# Patient Record
Sex: Male | Born: 1990 | Race: Black or African American | Hispanic: No | Marital: Married | Smoking: Never smoker
Health system: Southern US, Community
[De-identification: ages and names within clinical notes are randomized; demographics above are authoritative.]

## PROBLEM LIST (undated history)

## (undated) DIAGNOSIS — M549 Dorsalgia, unspecified: Secondary | ICD-10-CM

## (undated) DIAGNOSIS — G8929 Other chronic pain: Secondary | ICD-10-CM

## (undated) HISTORY — PX: ADENOIDECTOMY: SUR15

## (undated) HISTORY — PX: SKIN GRAFT: SHX250

## (undated) HISTORY — PX: WISDOM TOOTH EXTRACTION: SHX21

---

## 2011-11-12 ENCOUNTER — Ambulatory Visit (HOSPITAL_COMMUNITY): Payer: Medicaid Other | Admitting: Physician Assistant

## 2012-09-18 ENCOUNTER — Emergency Department (HOSPITAL_COMMUNITY)
Admission: EM | Admit: 2012-09-18 | Discharge: 2012-09-18 | Disposition: A | Discharge status: Home / Self Care | Payer: Medicaid Other | Attending: Emergency Medicine | Admitting: Emergency Medicine

## 2012-09-18 ENCOUNTER — Emergency Department (HOSPITAL_COMMUNITY): Payer: Medicaid Other

## 2012-09-18 ENCOUNTER — Encounter (HOSPITAL_COMMUNITY): Payer: Self-pay | Admitting: Emergency Medicine

## 2012-09-18 DIAGNOSIS — R059 Cough, unspecified: Secondary | ICD-10-CM | POA: Insufficient documentation

## 2012-09-18 DIAGNOSIS — R5383 Other fatigue: Secondary | ICD-10-CM | POA: Insufficient documentation

## 2012-09-18 DIAGNOSIS — R5381 Other malaise: Secondary | ICD-10-CM | POA: Insufficient documentation

## 2012-09-18 DIAGNOSIS — Z79899 Other long term (current) drug therapy: Secondary | ICD-10-CM | POA: Insufficient documentation

## 2012-09-18 DIAGNOSIS — R05 Cough: Secondary | ICD-10-CM | POA: Insufficient documentation

## 2012-09-18 DIAGNOSIS — B9789 Other viral agents as the cause of diseases classified elsewhere: Secondary | ICD-10-CM | POA: Insufficient documentation

## 2012-09-18 DIAGNOSIS — J3489 Other specified disorders of nose and nasal sinuses: Secondary | ICD-10-CM | POA: Insufficient documentation

## 2012-09-18 DIAGNOSIS — R630 Anorexia: Secondary | ICD-10-CM | POA: Insufficient documentation

## 2012-09-18 DIAGNOSIS — B349 Viral infection, unspecified: Secondary | ICD-10-CM

## 2012-09-18 MED ORDER — OXYCODONE-ACETAMINOPHEN 5-325 MG PO TABS: 1.0000 | ORAL_TABLET | Freq: Four times a day (QID) | ORAL | Status: DC | PRN

## 2012-09-18 MED ORDER — MORPHINE SULFATE 4 MG/ML IJ SOLN
4.0000 mg | Freq: Once | INTRAMUSCULAR | Status: AC
  Administered 2012-09-18: 4 mg via INTRAVENOUS
  Filled 2012-09-18: qty 1

## 2012-09-18 MED ORDER — GUAIFENESIN-DM 100-10 MG/5ML PO SYRP: 5.0000 mL | ORAL_SOLUTION | Freq: Three times a day (TID) | ORAL | Status: DC | PRN

## 2012-09-18 MED ORDER — SODIUM CHLORIDE 0.9 % IV BOLUS (SEPSIS)
1000.0000 mL | Freq: Once | INTRAVENOUS | Status: AC
  Administered 2012-09-18: 1000 mL via INTRAVENOUS

## 2012-09-18 NOTE — ED Notes (Signed)
Patient transported to X-ray 

## 2012-09-18 NOTE — ED Notes (Signed)
Pt states he has been sick since before Christmas. Pt states he has been having a cough and chest congestion.

## 2012-09-18 NOTE — ED Provider Notes (Signed)
History     CSN: [id number]  Arrival date & time 09/18/12  9562   First MD Initiated Contact with Patient 09/18/12 0355      Chief Complaint  Patient presents with  . Flu like symptoms     (Consider location/radiation/quality/duration/timing/severity/associated sxs/prior treatment) The history is provided by the patient.   patient states that he's been sick since before Christmas. He states he had cough chills and myalgias. He states he started to feel a little better then got worse again. He states his coughing and feeling bad all over. He vomited once. No diarrhea. He states he has been coughing up some sputum. He does have low-grade temperatures. No abdominal pain. No severe dyspnea.  History reviewed. No pertinent past medical history.  Past Surgical History  Procedure Date  . Skin graft     21-72 years old. on Right index finger  . Wisdom tooth extraction     Family History  Problem Relation Age of Onset  . Diabetes Mother   . Osteoarthritis Father   . Diabetes Father   . Heart failure Maternal Aunt     History  Substance Use Topics  . Smoking status: Never Smoker   . Smokeless tobacco: Not on file  . Alcohol Use: Yes     Comment: Occasionally      Review of Systems  Constitutional: Positive for appetite change and fatigue. Negative for activity change.  HENT: Positive for rhinorrhea. Negative for sore throat and neck stiffness.   Eyes: Negative for pain.  Respiratory: Positive for cough. Negative for chest tightness and shortness of breath.   Cardiovascular: Negative for chest pain and leg swelling.  Gastrointestinal: Negative for nausea, vomiting, abdominal pain and diarrhea.  Genitourinary: Negative for flank pain.  Musculoskeletal: Positive for myalgias. Negative for back pain.  Skin: Negative for rash.  Neurological: Negative for weakness, numbness and headaches.  Psychiatric/Behavioral: Negative for behavioral problems.    Allergies  Asa; Codeine;  and Penicillins  Home Medications   Current Outpatient Rx  Name  Route  Sig  Dispense  Refill  . GUAIFENESIN-DM 100-10 MG/5ML PO SYRP   Oral   Take 5 mLs by mouth 3 (three) times daily as needed for cough.   118 mL   0   . OXYCODONE-ACETAMINOPHEN 5-325 MG PO TABS   Oral   Take 1-2 tablets by mouth every 6 (six) hours as needed for pain.   15 tablet   0     BP 151/94  Pulse 76  Temp 98.4 F (36.9 C) (Oral)  Resp 18  SpO2 98%  Physical Exam  Constitutional: He is oriented to person, place, and time. He appears well-developed and well-nourished.  HENT:  Head: Normocephalic.  Eyes: Pupils are equal, round, and reactive to light.  Neck: Neck supple.  Cardiovascular: Normal rate and regular rhythm.   Pulmonary/Chest: Effort normal and breath sounds normal.  Abdominal: Soft. Bowel sounds are normal.  Musculoskeletal: Normal range of motion.  Lymphadenopathy:    He has no cervical adenopathy.  Neurological: He is alert and oriented to person, place, and time.  Skin: Skin is warm.  Psychiatric: He has a normal mood and affect.    ED Course  Procedures (including critical care time)  Labs Reviewed - No data to display Dg Chest 2 View  09/18/2012  *RADIOLOGY REPORT*  Clinical Data: Persistent cough and congestion.  Vomiting.  CHEST - 2 VIEW  Comparison: None.  Findings: The lungs are relatively well-aerated and clear.  There is no evidence of focal opacification, pleural effusion or pneumothorax.  The heart is borderline enlarged.  No acute osseous abnormalities are seen.  IMPRESSION: No acute cardiopulmonary process seen; borderline cardiomegaly.   Original Report Authenticated By: Tonia Ghent, M.D.      1. Viral syndrome       MDM  Patient with viral syndrome. X-ray was done and showed no pneumonia. Feels better after fluid bolus and some morphine. Patient was discharged home   Juliet Rude. Rubin Payor, MD 09/18/12 [id number]

## 2013-03-27 ENCOUNTER — Emergency Department (HOSPITAL_COMMUNITY)
Admission: EM | Admit: 2013-03-27 | Discharge: 2013-03-28 | Disposition: A | Discharge status: Home / Self Care | Payer: Medicaid Other | Attending: Emergency Medicine | Admitting: Emergency Medicine

## 2013-03-27 ENCOUNTER — Encounter (HOSPITAL_COMMUNITY): Payer: Self-pay | Admitting: *Deleted

## 2013-03-27 DIAGNOSIS — M549 Dorsalgia, unspecified: Secondary | ICD-10-CM

## 2013-03-27 DIAGNOSIS — M546 Pain in thoracic spine: Secondary | ICD-10-CM | POA: Insufficient documentation

## 2013-03-27 DIAGNOSIS — G8929 Other chronic pain: Secondary | ICD-10-CM | POA: Insufficient documentation

## 2013-03-27 HISTORY — DX: Other chronic pain: G89.29

## 2013-03-27 HISTORY — DX: Dorsalgia, unspecified: M54.9

## 2013-03-27 MED ORDER — METAXALONE 800 MG PO TABS: 800.0000 mg | ORAL_TABLET | Freq: Three times a day (TID) | ORAL | Status: DC

## 2013-03-27 MED ORDER — METAXALONE 800 MG PO TABS
800.0000 mg | ORAL_TABLET | Freq: Once | ORAL | Status: AC
  Administered 2013-03-27: 800 mg via ORAL
  Filled 2013-03-27: qty 1

## 2013-03-27 MED ORDER — OXYCODONE HCL 5 MG PO TABS
5.0000 mg | ORAL_TABLET | Freq: Once | ORAL | Status: AC
  Administered 2013-03-27: 5 mg via ORAL
  Filled 2013-03-27: qty 1

## 2013-03-27 MED ORDER — OXYCODONE HCL 5 MG PO TABS: 5.0000 mg | ORAL_TABLET | Freq: Four times a day (QID) | ORAL | Status: DC | PRN

## 2013-03-27 NOTE — ED Notes (Signed)
Pt states he has an old football injury to his back. Today worse than usual. Pain located between mid and lower back bilaterally.

## 2013-03-27 NOTE — ED Provider Notes (Signed)
History    CSN: [id number] Arrival date & time 03/27/13  2251  First MD Initiated Contact with Patient 03/27/13 2324     Chief Complaint  Patient presents with  . Back Pain   (Consider location/radiation/quality/duration/timing/severity/associated sxs/prior Treatment) HPI Comments: Has old football injury Has had 2 steroid injection in region last several years ago.  Now in [place], no PCP and increased pain   Has taken Ibuprofen with no relief   Patient is a 21 y.o. male presenting with back pain. The history is provided by the patient.  Back Pain Location:  Thoracic spine Quality:  Aching Radiates to:  Does not radiate Pain severity:  Moderate Onset quality:  Gradual Timing:  Constant Progression:  Worsening Chronicity:  Recurrent Relieved by:  Nothing Worsened by:  NSAIDs Ineffective treatments:  Ibuprofen Associated symptoms: no fever   Risk factors: obesity    Past Medical History  Diagnosis Date  . Back pain, chronic    Past Surgical History  Procedure Laterality Date  . Skin graft      32-59 years old. on Right index finger  . Wisdom tooth extraction     Family History  Problem Relation Age of Onset  . Diabetes Mother   . Osteoarthritis Father   . Diabetes Father   . Heart failure Maternal Aunt    History  Substance Use Topics  . Smoking status: Never Smoker   . Smokeless tobacco: Not on file  . Alcohol Use: Yes     Comment: Occasionally    Review of Systems  Constitutional: Negative for fever.  Respiratory: Negative for shortness of breath.   Musculoskeletal: Positive for back pain. Negative for joint swelling and gait problem.  Skin: Negative for rash and wound.  All other systems reviewed and are negative.    Allergies  Asa; Tylenol; Codeine; and Penicillins  Home Medications   Current Outpatient Rx  Name  Route  Sig  Dispense  Refill  . ibuprofen (ADVIL,MOTRIN) 200 MG tablet   Oral   Take 200 mg by mouth daily as needed for  pain.         . metaxalone (SKELAXIN) 800 MG tablet   Oral   Take 1 tablet (800 mg total) by mouth 3 (three) times daily.   30 tablet   0   . oxyCODONE (OXY IR/ROXICODONE) 5 MG immediate release tablet   Oral   Take 1 tablet (5 mg total) by mouth every 6 (six) hours as needed for pain (severe pain).   12 tablet   0    BP 164/85  Pulse 112  Temp(Src) 102.2 F (39 C) (Oral)  Resp 18  SpO2 96% Physical Exam  Nursing note and vitals reviewed. Constitutional: He is oriented to person, place, and time. He appears well-developed and well-nourished.  HENT:  Head: Normocephalic.  Eyes: Pupils are equal, round, and reactive to light.  Neck: Normal range of motion.  Cardiovascular: Normal rate.   Pulmonary/Chest: No respiratory distress.  Musculoskeletal: Normal range of motion.  Neurological: He is alert and oriented to person, place, and time.  Skin: Skin is warm. No rash noted. No erythema. No pallor.    ED Course  Procedures (including critical care time) Labs Reviewed - No data to display No results found. 1. Chronic back pain     MDM  Will treat with muscle relaxor. There is no obvious sign of injury, no rash, decrease in ROM, no change in gait   Arman Filter, NP  03/27/13 2342 

## 2013-03-28 NOTE — ED Provider Notes (Signed)
Medical screening examination/treatment/procedure(s) were performed by non-physician practitioner and as supervising physician I was immediately available for consultation/collaboration.  John-Adam Dyasia Firestine, M.D.     John-Adam Issis Lindseth, MD 03/28/13 0847 

## 2013-07-23 ENCOUNTER — Emergency Department (HOSPITAL_COMMUNITY)
Admission: EM | Admit: 2013-07-23 | Discharge: 2013-07-23 | Disposition: A | Discharge status: Home / Self Care | Payer: Medicaid Other | Attending: Emergency Medicine | Admitting: Emergency Medicine

## 2013-07-23 DIAGNOSIS — Z880 Allergy status to penicillin: Secondary | ICD-10-CM | POA: Insufficient documentation

## 2013-07-23 DIAGNOSIS — T7840XA Allergy, unspecified, initial encounter: Secondary | ICD-10-CM

## 2013-07-23 DIAGNOSIS — IMO0002 Reserved for concepts with insufficient information to code with codable children: Secondary | ICD-10-CM | POA: Insufficient documentation

## 2013-07-23 DIAGNOSIS — R21 Rash and other nonspecific skin eruption: Secondary | ICD-10-CM | POA: Insufficient documentation

## 2013-07-23 MED ORDER — DEXAMETHASONE SODIUM PHOSPHATE 10 MG/ML IJ SOLN: 10.0000 mg | Freq: Once | INTRAMUSCULAR | Status: DC

## 2013-07-23 MED ORDER — DEXAMETHASONE SODIUM PHOSPHATE 10 MG/ML IJ SOLN
10.0000 mg | Freq: Once | INTRAMUSCULAR | Status: AC
  Administered 2013-07-23: 10 mg via INTRAMUSCULAR
  Filled 2013-07-23: qty 1

## 2013-07-23 MED ORDER — DIPHENHYDRAMINE HCL 25 MG PO TABS: 25.0000 mg | ORAL_TABLET | Freq: Four times a day (QID) | ORAL | Status: DC

## 2013-07-23 MED ORDER — DIPHENHYDRAMINE HCL 25 MG PO CAPS
25.0000 mg | ORAL_CAPSULE | Freq: Once | ORAL | Status: AC
  Administered 2013-07-23: 25 mg via ORAL
  Filled 2013-07-23: qty 1

## 2013-07-23 NOTE — ED Provider Notes (Signed)
CSN: [id number]     Arrival date & time 07/23/13  2016 History  This chart was scribed for non-physician practitioner Ivonne Andrew, PA-C working with Gavin Pound. Oletta Lamas, MD by Caryn Bee, ED Scribe. This patient was seen in room WTR5/WTR5 and the patient's care was started at 8:49 PM.    Chief Complaint  Patient presents with  . Rash    HPI HPI Comments: Curtis Vega is a 22 y.o. male who presents to the Emergency Department complaining of sudden onset, itchy rash that pt noticed upon waking up on 07/20/2013. Pt states that when he woke up on 07/19/2013 morning his right arm was itching.  The rash has since spread to his lower back, bilateral legs, back of his neck, ankles, and feet. Pt tried using cortisone cream with no relief. Pt thinks that it may have been the laundry detergent that his mother used when he went to visit. He denies fever, sweats, chills, mouth or tongue swelling. He denies eating any new foods recently, using any new soaps or lotions, being in the woods recently or being bitten by anything. Pt denies allergies or any other medical problems. He denies anyone else in his family having similar symptoms. Pt has had chicken pox. Pt does not take regular medications.    Past Medical History  Diagnosis Date  . Back pain, chronic    Past Surgical History  Procedure Laterality Date  . Skin graft      60-71 years old. on Right index finger  . Wisdom tooth extraction     Family History  Problem Relation Age of Onset  . Diabetes Mother   . Osteoarthritis Father   . Diabetes Father   . Heart failure Maternal Aunt    History  Substance Use Topics  . Smoking status: Never Smoker   . Smokeless tobacco: Not on file  . Alcohol Use: Yes     Comment: Occasionally    Review of Systems  Constitutional: Negative for fever, chills and diaphoresis.  HENT: Negative for mouth sores.   Skin: Positive for color change (Erythema ) and rash (bilateral arms, bilateral legs, back, neck).   All other systems reviewed and are negative.    Allergies  Asa; Tylenol; Codeine; and Penicillins  Home Medications   Current Outpatient Rx  Name  Route  Sig  Dispense  Refill  . hydrocortisone cream 1 %   Topical   Apply 1 application topically 2 (two) times daily.          Triage Vitals: BP 132/79  Pulse 69  Temp(Src) 98.3 F (36.8 C) (Oral)  Resp 16  SpO2 98%  Physical Exam  Nursing note and vitals reviewed. Constitutional: He is oriented to person, place, and time. He appears well-developed and well-nourished. No distress.  HENT:  Head: Normocephalic and atraumatic.  Mouth/Throat: Oropharynx is clear and moist. No oral lesions. No oropharyngeal exudate, posterior oropharyngeal edema or posterior oropharyngeal erythema.  Eyes: EOM are normal.  Neck: Neck supple. No tracheal deviation present.  Cardiovascular: Normal rate, regular rhythm and normal heart sounds.  Exam reveals no gallop and no friction rub.   No murmur heard. Pulmonary/Chest: Effort normal and breath sounds normal. No respiratory distress. He has no wheezes. He has no rales. He exhibits no tenderness.  Musculoskeletal: Normal range of motion.  Neurological: He is alert and oriented to person, place, and time.  Skin: Skin is warm and dry. Rash noted.  Diffuse papular rash with erythema on lower back, bilateral  arms, bilateral legs, and neck.  Psychiatric: He has a normal mood and affect. His behavior is normal.    ED Course  Procedures  DIAGNOSTIC STUDIES: Oxygen Saturation is 98% on room air, normal by my interpretation.    COORDINATION OF CARE: 8:59 PM-patient is well-appearing in no acute distress. Discussed treatment plan with pt at bedside and pt agreed to plan.    MDM   1. Rash   2. Allergic reaction, initial encounter      I personally performed the services described in this documentation, which was scribed in my presence. The recorded information has been reviewed and is  accurate.    Angus Seller, PA-C 07/24/13 0020

## 2013-07-23 NOTE — ED Notes (Signed)
Pt has bumpy papular rash that started on Monday. Now rash has spread to trunk, legs, neck and back. Pt states he had some itching initially, but not now. Pt states he has used cortisone cream to affected areas. Pt with no acute distress.

## 2013-07-24 NOTE — ED Provider Notes (Signed)
Medical screening examination/treatment/procedure(s) were performed by non-physician practitioner and as supervising physician I was immediately available for consultation/collaboration.  EKG Interpretation   None         Gavin Pound. Lasonya Hubner, MD 07/24/13 1037

## 2013-08-11 ENCOUNTER — Encounter (HOSPITAL_COMMUNITY): Payer: Self-pay | Admitting: Emergency Medicine

## 2013-08-11 ENCOUNTER — Emergency Department (HOSPITAL_COMMUNITY)
Admission: EM | Admit: 2013-08-11 | Discharge: 2013-08-11 | Disposition: A | Discharge status: Home / Self Care | Payer: Medicaid Other | Attending: Emergency Medicine | Admitting: Emergency Medicine

## 2013-08-11 ENCOUNTER — Emergency Department (HOSPITAL_COMMUNITY): Payer: Medicaid Other

## 2013-08-11 DIAGNOSIS — Z880 Allergy status to penicillin: Secondary | ICD-10-CM | POA: Insufficient documentation

## 2013-08-11 DIAGNOSIS — IMO0001 Reserved for inherently not codable concepts without codable children: Secondary | ICD-10-CM | POA: Insufficient documentation

## 2013-08-11 DIAGNOSIS — G8929 Other chronic pain: Secondary | ICD-10-CM | POA: Insufficient documentation

## 2013-08-11 DIAGNOSIS — R630 Anorexia: Secondary | ICD-10-CM | POA: Insufficient documentation

## 2013-08-11 DIAGNOSIS — J020 Streptococcal pharyngitis: Secondary | ICD-10-CM

## 2013-08-11 LAB — RAPID STREP SCREEN (MED CTR MEBANE ONLY): Streptococcus, Group A Screen (Direct): POSITIVE — AB

## 2013-08-11 MED ORDER — CLINDAMYCIN HCL 150 MG PO CAPS: 450.0000 mg | ORAL_CAPSULE | Freq: Three times a day (TID) | ORAL | Status: DC

## 2013-08-11 NOTE — ED Provider Notes (Signed)
CSN: [id number]     Arrival date & time 08/11/13  1800 History   First MD Initiated Contact with Patient 08/11/13 1804    This chart was scribed for Vinetta Bergamo, a non-physician practitioner working with Ethelda Chick, MD by Lewanda Rife, ED Scribe. This patient was seen in room WTR6/WTR6 and the patient's care was started at 8:12 PM     Chief Complaint  Patient presents with  . Fever  . Sore Throat   (Consider location/radiation/quality/duration/timing/severity/associated sxs/prior Treatment) The history is provided by the patient. No language interpreter was used.   HPI Comments: Curtis Vega is a 22 y.o. male who presents to the Emergency Department complaining of worsening constant sore throat onset 2 days. Reports associated fever, generalized myalgias, decreased appetite, chills, sweats, and headache. Reports sore throat is exacerbated by swallowing food. Denies any alleviating factors. Denies associated cough, abdominal pain, nausea, vomiting, neck stiffness, diarrhea, otalgia, eye pain, visual disturbances, nasal congestion, and difficulty breathing. Reports taking ibuprofen with mild relief of symptoms. Denies sick contacts.    Past Medical History  Diagnosis Date  . Back pain, chronic    Past Surgical History  Procedure Laterality Date  . Skin graft      32-19 years old. on Right index finger  . Wisdom tooth extraction     Family History  Problem Relation Age of Onset  . Diabetes Mother   . Osteoarthritis Father   . Diabetes Father   . Heart failure Maternal Aunt    History  Substance Use Topics  . Smoking status: Never Smoker   . Smokeless tobacco: Not on file  . Alcohol Use: Yes     Comment: Occasionally    Review of Systems  Constitutional: Positive for fever.  HENT: Positive for sore throat.   All other systems reviewed and are negative.    Allergies  Asa; Tylenol; Codeine; and Penicillins  Home Medications   Current Outpatient Rx   Name  Route  Sig  Dispense  Refill  . ibuprofen (ADVIL,MOTRIN) 200 MG tablet   Oral   Take 200 mg by mouth every 6 (six) hours as needed for mild pain.         . clindamycin (CLEOCIN) 150 MG capsule   Oral   Take 3 capsules (450 mg total) by mouth 3 (three) times daily.   90 capsule   0    BP 145/71  Pulse 106  Temp(Src) 99.9 F (37.7 C) (Oral)  Resp 16  SpO2 97% Physical Exam  Nursing note and vitals reviewed. Constitutional: He is oriented to person, place, and time. He appears well-developed and well-nourished. No distress.  HENT:  Head: Normocephalic and atraumatic.  Right Ear: External ear normal.  Left Ear: External ear normal.  Mouth/Throat: Oropharyngeal exudate present.  Negative facial swelling Swelling and erythema noted to the posterior oropharynx and bilateral tonsils. Bilateral tonsillar identified to have exudate. Uvula midline, symmetrical elevation. Negative petechiae. Negative oral lesions noted. Negative trismus.  Eyes: Conjunctivae and EOM are normal. Pupils are equal, round, and reactive to light. Right eye exhibits no discharge. Left eye exhibits no discharge.  Neck: Normal range of motion. Neck supple. No tracheal deviation present.  Positive tonsillar adenopathy, bilaterally - soft, tender upon palpation, mobile. Negative submental submandibular adenopathy identified. Negative neck stiffness, negative nuchal rigidity Negative meningeal signs  Cardiovascular: Normal rate, regular rhythm and normal heart sounds.  Exam reveals no friction rub.   No murmur heard. Pulses:  Radial pulses are 2+ on the right side, and 2+ on the left side.  Pulmonary/Chest: Effort normal and breath sounds normal. No respiratory distress. He has no wheezes. He has no rales.  Musculoskeletal: Normal range of motion.  Full range of motion to upper and lower extremities bilaterally without difficulty noted  Lymphadenopathy:    He has cervical adenopathy (bilateral  tonsillar adenopathy noted - soft, mobile, mild discomfort upon palpation ).  Neurological: He is alert and oriented to person, place, and time. He exhibits normal muscle tone. Coordination normal.  Skin: Skin is warm and dry. No rash noted. He is not diaphoretic. No erythema.  Psychiatric: He has a normal mood and affect. His behavior is normal. Thought content normal.    ED Course  Procedures  COORDINATION OF CARE:  Nursing notes reviewed. Vital signs reviewed. Initial pt interview and examination performed.   8:12 PM-Discussed work up plan with pt at bedside, which includes rapid strep screen. Pt agrees with plan.  8:12 PM Nursing Notes Reviewed/ Care Coordinated Applicable Imaging Reviewed  Interpretation of Laboratory Data incorporated into ED treatment Discussed results and treatment plan with pt. Pt demonstrates understanding and agrees with plan.   Treatment plan initiated:Medications - No data to display   Initial diagnostic testing ordered.    Labs Review Labs Reviewed  RAPID STREP SCREEN - Abnormal; Notable for the following:    Streptococcus, Group A Screen (Direct) POSITIVE (*)    All other components within normal limits   Imaging Review Dg Chest 2 View  08/11/2013   CLINICAL DATA:  Fever.  EXAM: CHEST  2 VIEW  COMPARISON:  September 18, 2012.  FINDINGS: The heart size and mediastinal contours are within normal limits. Both lungs are clear. No pleural effusion or pneumothorax is noted. The visualized skeletal structures are unremarkable.  IMPRESSION: No active cardiopulmonary disease.   Electronically Signed   By: Roque Lias M.D.   On: 08/11/2013 18:42    EKG Interpretation   None       MDM   1. Streptococcal pharyngitis    Filed Vitals:   08/11/13 1806  BP: 145/71  Pulse: 106  Temp: 99.9 F (37.7 C)  TempSrc: Oral  Resp: 16  SpO2: 97%   I personally performed the services described in this documentation, which was scribed in my presence. The  recorded information has been reviewed and is accurate.  Patient presenting to emergency department with sore throat that started Tuesday and has gotten progressively worse with subjective fever and generalized bodyaches. Patient reports that he has discomfort with swallowing, worsening with solids than with liquids. Patient reports he's been using ibuprofen as needed for discomfort. Alert and oriented. GCS 15. Heart rate and rhythm normal. Lungs clear to auscultation bilaterally. Full range of motion to upper and lower extremities noted. Negative facial swelling. Bilateral tonsillar adenopathy with exudate present. Negative petechiae noted. Uvula midline, symmetrical elevation. Discomfort upon palpation to the tonsils-mobile, tender upon palpation, soft - negative remaining lymphadenopathy. Negative nuchal rigidity, negative neck stiffness, negative meningeal signs. Rapid strep test positive for group A streptococcal infection. Chest x-ray negative acute cardiopulmonary disease. Patient stable, afebrile. Doubt peritonsillar abscess. Positive streptococcal swab test, streptococcal pharyngitis noted. Discharged patient. Discharged patient with antibiotics. Discussed with patient to rest and stay hydrated. [facility]. Discussed with patient to closely monitor symptoms and if symptoms are to worsen or change to report back to emergency department - strict return structures given.  Patient agreed to plan of care, understood, all questions answered.  Raymon Mutton, PA-C 08/12/13 1603

## 2013-08-11 NOTE — ED Notes (Signed)
Pt reports sore throat since Monday, 8/10. Denies cough.

## 2013-08-13 NOTE — ED Provider Notes (Signed)
Medical screening examination/treatment/procedure(s) were performed by non-physician practitioner and as supervising physician I was immediately available for consultation/collaboration.  EKG Interpretation   None        Irvine Glorioso K Linker, MD 08/13/13 1506 

## 2013-09-17 ENCOUNTER — Emergency Department (HOSPITAL_COMMUNITY)
Admission: EM | Admit: 2013-09-17 | Discharge: 2013-09-17 | Disposition: A | Discharge status: Home / Self Care | Payer: Medicaid Other | Attending: Emergency Medicine | Admitting: Emergency Medicine

## 2013-09-17 ENCOUNTER — Encounter (HOSPITAL_COMMUNITY): Payer: Self-pay | Admitting: Emergency Medicine

## 2013-09-17 DIAGNOSIS — G8929 Other chronic pain: Secondary | ICD-10-CM | POA: Insufficient documentation

## 2013-09-17 DIAGNOSIS — M549 Dorsalgia, unspecified: Secondary | ICD-10-CM | POA: Insufficient documentation

## 2013-09-17 DIAGNOSIS — J020 Streptococcal pharyngitis: Secondary | ICD-10-CM

## 2013-09-17 DIAGNOSIS — Z880 Allergy status to penicillin: Secondary | ICD-10-CM | POA: Insufficient documentation

## 2013-09-17 LAB — RAPID STREP SCREEN (MED CTR MEBANE ONLY): Streptococcus, Group A Screen (Direct): POSITIVE — AB

## 2013-09-17 MED ORDER — CLINDAMYCIN HCL 150 MG PO CAPS: 150.0000 mg | ORAL_CAPSULE | Freq: Four times a day (QID) | ORAL | Status: DC

## 2013-09-17 NOTE — ED Provider Notes (Signed)
CSN: [id number]     Arrival date & time 09/17/13  1352 History  This chart was scribed for non-physician practitioner, Jovita Kussmauliffany Karoline Fleer-PA, working with Candyce ChurnJohn David Wofford, MD by Smiley HousemanFallon Davis, ED Scribe. This patient was seen in room WTR8/WTR8 and the patient's care was started at 3:15 PM.    Chief Complaint  Patient presents with  . Sore Throat    intermittent, recurrent   The history is provided by the patient. No language interpreter was used.   HPI Comments: Georgianne FickJirian Jasperson is a 23 y.o. male who presents to the Emergency Department complaining of a intermittent recurrent sore throat that started about 3 days ago.  He reports the pain is currently a 3 out of 10.  He was diagnosed with Strep throat about a month ago.  He states he took the antibiotic as prescribed, but didn't relieve his sore throat.   He denies the pain being severe and he notices it most in the morning. He was referred to ENT but did not go for f/u as advised. He is allergic to PCN and cannot remember what abx he was given (I reviewed previous chart and was unable to find previous Rx'd abx).  He denies lateralization of pain, denies being unable to swallow or choking on secretions. Denies any other symptoms at this time.  Past Medical History  Diagnosis Date  . Back pain, chronic    Past Surgical History  Procedure Laterality Date  . Skin graft      5013-23 years old. on Right index finger  . Wisdom tooth extraction     Family History  Problem Relation Age of Onset  . Diabetes Mother   . Osteoarthritis Father   . Diabetes Father   . Heart failure Maternal Aunt    History  Substance Use Topics  . Smoking status: Never Smoker   . Smokeless tobacco: Not on file  . Alcohol Use: Yes     Comment: Occasionally    Review of Systems  Constitutional: Negative for fever and chills.  HENT: Positive for sore throat. Negative for congestion and rhinorrhea.   Respiratory: Negative for cough and shortness of breath.    Cardiovascular: Negative for chest pain.  Gastrointestinal: Negative for nausea, vomiting, abdominal pain and diarrhea.  Musculoskeletal: Negative for back pain.  Skin: Negative for color change and rash.  All other systems reviewed and are negative.    Allergies  Asa; Tylenol; Codeine; and Penicillins  Home Medications   Current Outpatient Rx  Name  Route  Sig  Dispense  Refill  . ibuprofen (ADVIL,MOTRIN) 200 MG tablet   Oral   Take 200 mg by mouth every 6 (six) hours as needed for mild pain.         . clindamycin (CLEOCIN) 150 MG capsule   Oral   Take 1 capsule (150 mg total) by mouth every 6 (six) hours.   28 capsule   0    Triage Vitals: BP 113/66  Pulse 80  Temp(Src) 98.4 F (36.9 C) (Oral)  Resp 18  SpO2 100% Physical Exam  Nursing note and vitals reviewed. Constitutional: He is oriented to person, place, and time. He appears well-developed and well-nourished. No distress.  HENT:  Head: Normocephalic and atraumatic.  Beefy red tonsils. Tenderness to palpation on tonsil and nodes.  Eyes: Conjunctivae and EOM are normal.  Neck: Neck supple. No tracheal deviation present.  Cardiovascular: Normal rate.   Pulmonary/Chest: Effort normal and breath sounds normal. No respiratory distress.  Musculoskeletal:  Normal range of motion.  Neurological: He is alert and oriented to person, place, and time.  Skin: Skin is warm and dry.  Psychiatric: He has a normal mood and affect. His behavior is normal.    ED Course  Procedures (including critical care time) DIAGNOSTIC STUDIES: Oxygen Saturation is 100% on RA, normal by my interpretation.    COORDINATION OF CARE: 3:18 PM-Informed pt his Strep test was positive and will discharge with clindamycin.  Patient informed of current plan of treatment and evaluation and agrees with plan.    Labs Review Labs Reviewed  RAPID STREP SCREEN - Abnormal; Notable for the following:    Streptococcus, Group A Screen (Direct)  POSITIVE (*)    All other components within normal limits    EKG Interpretation   None       MDM   1. Strep pharyngitis    22 y.o.Fredric Hochstein's evaluation in the Emergency Department is complete. It has been determined that no acute conditions requiring further emergency intervention are present at this time. The patient/guardian have been advised of the diagnosis and plan. We have discussed signs and symptoms that warrant return to the ED, such as changes or worsening in symptoms.  Vital signs are stable at discharge. Filed Vitals:   09/17/13 1441  BP: 113/66  Pulse: 80  Temp: 98.4 F (36.9 C)  Resp: 18    Patient/guardian has voiced understanding and agreed to follow-up with the PCP or specialist.  I personally performed the services described in this documentation, which was scribed in my presence. The recorded information has been reviewed and is accurate.   Dorthula Matas, PA-C 09/17/13 1531

## 2013-09-17 NOTE — ED Notes (Signed)
Pt reports throat pain x 3 days. Hx of recent treatment for strep.

## 2013-09-17 NOTE — Discharge Instructions (Signed)
Strep Throat  Strep throat is an infection of the throat caused by a bacteria named Streptococcus pyogenes. Your caregiver may call the infection streptococcal "tonsillitis" or "pharyngitis" depending on whether there are signs of inflammation in the tonsils or back of the throat. Strep throat is most common in children aged 24 15 years during the cold months of the year, but it can occur in people of any age during any season. This infection is spread from person to person (contagious) through coughing, sneezing, or other close contact.  SYMPTOMS   · Fever or chills.  · Painful, swollen, red tonsils or throat.  · Pain or difficulty when swallowing.  · White or yellow spots on the tonsils or throat.  · Swollen, tender lymph nodes or "glands" of the neck or under the jaw.  · Red rash all over the body (rare).  DIAGNOSIS   Many different infections can cause the same symptoms. A test must be done to confirm the diagnosis so the right treatment can be given. A "rapid strep test" can help your caregiver make the diagnosis in a few minutes. If this test is not available, a light swab of the infected area can be used for a throat culture test. If a throat culture test is done, results are usually available in a day or two.  TREATMENT   Strep throat is treated with antibiotic medicine.  HOME CARE INSTRUCTIONS   · Gargle with 1 tsp of salt in 1 cup of warm water, 3 4 times per day or as needed for comfort.  · Family members who also have a sore throat or fever should be tested for strep throat and treated with antibiotics if they have the strep infection.  · Make sure everyone in your household washes their hands well.  · Do not share food, drinking cups, or personal items that could cause the infection to spread to others.  · You may need to eat a soft food diet until your sore throat gets better.  · Drink enough water and fluids to keep your urine clear or pale yellow. This will help prevent dehydration.  · Get plenty of  rest.  · Stay home from school, daycare, or work until you have been on antibiotics for 24 hours.  · Only take over-the-counter or prescription medicines for pain, discomfort, or fever as directed by your caregiver.  · If antibiotics are prescribed, take them as directed. Finish them even if you start to feel better.  SEEK MEDICAL CARE IF:   · The glands in your neck continue to enlarge.  · You develop a rash, cough, or earache.  · You cough up green, yellow-brown, or bloody sputum.  · You have pain or discomfort not controlled by medicines.  · Your problems seem to be getting worse rather than better.  SEEK IMMEDIATE MEDICAL CARE IF:   · You develop any new symptoms such as vomiting, severe headache, stiff or painful neck, chest pain, shortness of breath, or trouble swallowing.  · You develop severe throat pain, drooling, or changes in your voice.  · You develop swelling of the neck, or the skin on the neck becomes red and tender.  · You have a fever.  · You develop signs of dehydration, such as fatigue, dry mouth, and decreased urination.  · You become increasingly sleepy, or you cannot wake up completely.  Document Released: 08/30/2000 Document Revised: 08/19/2012 Document Reviewed: 11/01/2010  ExitCare® Patient Information ©2014 [employer].

## 2013-09-27 NOTE — ED Provider Notes (Signed)
Medical screening examination/treatment/procedure(s) were performed by non-physician practitioner and as supervising physician I was immediately available for consultation/collaboration.  EKG Interpretation   None         Standley Bargo, MD 09/27/13 0755 

## 2013-10-26 ENCOUNTER — Emergency Department (HOSPITAL_COMMUNITY)
Admission: EM | Admit: 2013-10-26 | Discharge: 2013-10-26 | Disposition: A | Discharge status: Home / Self Care | Payer: Medicaid Other | Attending: Emergency Medicine | Admitting: Emergency Medicine

## 2013-10-26 ENCOUNTER — Encounter (HOSPITAL_COMMUNITY): Payer: Self-pay | Admitting: Emergency Medicine

## 2013-10-26 DIAGNOSIS — J029 Acute pharyngitis, unspecified: Secondary | ICD-10-CM | POA: Insufficient documentation

## 2013-10-26 DIAGNOSIS — R05 Cough: Secondary | ICD-10-CM | POA: Insufficient documentation

## 2013-10-26 DIAGNOSIS — Z8739 Personal history of other diseases of the musculoskeletal system and connective tissue: Secondary | ICD-10-CM | POA: Insufficient documentation

## 2013-10-26 DIAGNOSIS — R059 Cough, unspecified: Secondary | ICD-10-CM | POA: Insufficient documentation

## 2013-10-26 DIAGNOSIS — Z8619 Personal history of other infectious and parasitic diseases: Secondary | ICD-10-CM | POA: Insufficient documentation

## 2013-10-26 DIAGNOSIS — R079 Chest pain, unspecified: Secondary | ICD-10-CM | POA: Insufficient documentation

## 2013-10-26 MED ORDER — AZITHROMYCIN 250 MG PO TABS: 250.0000 mg | ORAL_TABLET | Freq: Every day | ORAL | Status: DC

## 2013-10-26 MED ORDER — AZITHROMYCIN 250 MG PO TABS
500.0000 mg | ORAL_TABLET | Freq: Once | ORAL | Status: AC
  Administered 2013-10-26: 500 mg via ORAL
  Filled 2013-10-26: qty 2

## 2013-10-26 NOTE — ED Provider Notes (Signed)
CSN: [id number]     Arrival date & time 10/26/13  [id number] History   First MD Initiated Contact with Patient 10/26/13 0809     Chief Complaint  Patient presents with  . Sore Throat     (Consider location/radiation/quality/duration/timing/severity/associated sxs/prior Treatment) Patient is a 23 y.o. male presenting with pharyngitis. The history is provided by the patient. No language interpreter was used.  Sore Throat This is a new problem. Associated symptoms include a sore throat. Pertinent negatives include no chest pain, chills, coughing, fever, headaches, nausea, rash or vomiting. Associated symptoms comments: Recurrent sore throat yesterday. He has had positive strep tests each of the last three months. No current fever. He reports compliance with all oral abx for strep. .    Past Medical History  Diagnosis Date  . Back pain, chronic    Past Surgical History  Procedure Laterality Date  . Skin graft      6013-23 years old. on Right index finger  . Wisdom tooth extraction     Family History  Problem Relation Age of Onset  . Diabetes Mother   . Osteoarthritis Father   . Diabetes Father   . Heart failure Maternal Aunt    History  Substance Use Topics  . Smoking status: Never Smoker   . Smokeless tobacco: Not on file  . Alcohol Use: Yes     Comment: Occasionally    Review of Systems  Constitutional: Negative for fever and chills.  HENT: Positive for rhinorrhea and sore throat.   Respiratory: Negative for cough and shortness of breath.   Cardiovascular: Negative for chest pain.       Chest pain with cough.  Gastrointestinal: Negative.  Negative for nausea and vomiting.  Musculoskeletal: Negative.   Skin: Negative.  Negative for rash.  Neurological: Negative.  Negative for headaches.      Allergies  Asa; Tylenol; Codeine; and Penicillins  Home Medications  No current outpatient prescriptions on file. BP 152/96  Pulse 65  Temp(Src) 97.9 F (36.6 C) (Oral)  Resp  18  Ht 5\' 10"  (1.778 m)  Wt 338 lb 4 oz (153.429 kg)  BMI 48.53 kg/m2  SpO2 95% Physical Exam  Constitutional: He is oriented to person, place, and time. He appears well-developed and well-nourished. No distress.  HENT:  Mouth/Throat: Mucous membranes are not dry. Posterior oropharyngeal erythema present. No oropharyngeal exudate.  Tonsils swollen bilaterally. No visualized peritonsillar abscess.   Eyes: Conjunctivae are normal.  Neck: Normal range of motion.  Pulmonary/Chest: Effort normal.  Lymphadenopathy:    He has cervical adenopathy.  Neurological: He is alert and oriented to person, place, and time.  Skin: Skin is warm and dry.  Psychiatric: He has a normal mood and affect.    ED Course  Procedures (including critical care time) Labs Review Labs Reviewed - No data to display Imaging Review No results found.  EKG Interpretation   None       MDM   Final diagnoses:  None    1. Pharyngitis  Will treat as recurrent strep. Culture not performed. Discussed ENT follow up which he is trying to arrange.     Arnoldo HookerShari A Jerolyn Flenniken, PA-C 10/26/13 [phone]

## 2013-10-26 NOTE — ED Notes (Signed)
Patient states he started feeling bad yesterday.  Patient states getting worse.   Patient is supposed to get married Saturday and wants to be "clear of strep".

## 2013-10-26 NOTE — Discharge Instructions (Signed)
Salt Water Gargle °This solution will help make your mouth and throat feel better. °HOME CARE INSTRUCTIONS  °· Mix 1 teaspoon of salt in 8 ounces of warm water. °· Gargle with this solution as much or often as you need or as directed. Swish and gargle gently if you have any sores or wounds in your mouth. °· Do not swallow this mixture. °Document Released: 06/06/2004 Document Revised: 11/25/2011 Document Reviewed: 10/28/2008 °ExitCare® Patient Information ©2014 [employer]. °Strep Throat °Strep throat is an infection of the throat caused by a bacteria named Streptococcus pyogenes. Your caregiver may call the infection streptococcal "tonsillitis" or "pharyngitis" depending on whether there are signs of inflammation in the tonsils or back of the throat. Strep throat is most common in children aged 5 15 years during the cold months of the year, but it can occur in people of any age during any season. This infection is spread from person to person (contagious) through coughing, sneezing, or other close contact. °SYMPTOMS  °· Fever or chills. °· Painful, swollen, red tonsils or throat. °· Pain or difficulty when swallowing. °· White or yellow spots on the tonsils or throat. °· Swollen, tender lymph nodes or "glands" of the neck or under the jaw. °· Red rash all over the body (rare). °DIAGNOSIS  °Many different infections can cause the same symptoms. A test must be done to confirm the diagnosis so the right treatment can be given. A "rapid strep test" can help your caregiver make the diagnosis in a few minutes. If this test is not available, a light swab of the infected area can be used for a throat culture test. If a throat culture test is done, results are usually available in a day or two. °TREATMENT  °Strep throat is treated with antibiotic medicine. °HOME CARE INSTRUCTIONS  °· Gargle with 1 tsp of salt in 1 cup of warm water, 3 4 times per day or as needed for comfort. °· Family members who also have a sore throat  or fever should be tested for strep throat and treated with antibiotics if they have the strep infection. °· Make sure everyone in your household washes their hands well. °· Do not share food, drinking cups, or personal items that could cause the infection to spread to others. °· You may need to eat a soft food diet until your sore throat gets better. °· Drink enough water and fluids to keep your urine clear or pale yellow. This will help prevent dehydration. °· Get plenty of rest. °· Stay home from school, daycare, or work until you have been on antibiotics for 24 hours. °· Only take over-the-counter or prescription medicines for pain, discomfort, or fever as directed by your caregiver. °· If antibiotics are prescribed, take them as directed. Finish them even if you start to feel better. °SEEK MEDICAL CARE IF:  °· The glands in your neck continue to enlarge. °· You develop a rash, cough, or earache. °· You cough up green, yellow-brown, or bloody sputum. °· You have pain or discomfort not controlled by medicines. °· Your problems seem to be getting worse rather than better. °SEEK IMMEDIATE MEDICAL CARE IF:  °· You develop any new symptoms such as vomiting, severe headache, stiff or painful neck, chest pain, shortness of breath, or trouble swallowing. °· You develop severe throat pain, drooling, or changes in your voice. °· You develop swelling of the neck, or the skin on the neck becomes red and tender. °· You have a fever. °·   You develop signs of dehydration, such as fatigue, dry mouth, and decreased urination. °· You become increasingly sleepy, or you cannot wake up completely. °Document Released: 08/30/2000 Document Revised: 08/19/2012 Document Reviewed: 11/01/2010 °ExitCare® Patient Information ©2014 [employer]. ° °

## 2013-10-26 NOTE — ED Provider Notes (Signed)
Medical screening examination/treatment/procedure(s) were performed by non-physician practitioner and as supervising physician I was immediately available for consultation/collaboration.  Jasneet Schobert, MD 10/26/13 1550 

## 2013-12-04 ENCOUNTER — Emergency Department (HOSPITAL_COMMUNITY): Payer: Medicaid Other

## 2013-12-04 ENCOUNTER — Emergency Department (HOSPITAL_COMMUNITY)
Admission: EM | Admit: 2013-12-04 | Discharge: 2013-12-04 | Disposition: A | Discharge status: Home / Self Care | Payer: Medicaid Other | Attending: Emergency Medicine | Admitting: Emergency Medicine

## 2013-12-04 ENCOUNTER — Encounter (HOSPITAL_COMMUNITY): Payer: Self-pay | Admitting: Emergency Medicine

## 2013-12-04 DIAGNOSIS — Y9389 Activity, other specified: Secondary | ICD-10-CM | POA: Insufficient documentation

## 2013-12-04 DIAGNOSIS — W230XXA Caught, crushed, jammed, or pinched between moving objects, initial encounter: Secondary | ICD-10-CM | POA: Insufficient documentation

## 2013-12-04 DIAGNOSIS — S60229A Contusion of unspecified hand, initial encounter: Secondary | ICD-10-CM

## 2013-12-04 DIAGNOSIS — Y929 Unspecified place or not applicable: Secondary | ICD-10-CM | POA: Insufficient documentation

## 2013-12-04 DIAGNOSIS — Z880 Allergy status to penicillin: Secondary | ICD-10-CM | POA: Insufficient documentation

## 2013-12-04 DIAGNOSIS — Z792 Long term (current) use of antibiotics: Secondary | ICD-10-CM | POA: Insufficient documentation

## 2013-12-04 DIAGNOSIS — G8929 Other chronic pain: Secondary | ICD-10-CM | POA: Insufficient documentation

## 2013-12-04 MED ORDER — OXYCODONE HCL 5 MG PO TABS: 5.0000 mg | ORAL_TABLET | Freq: Four times a day (QID) | ORAL | Status: DC | PRN

## 2013-12-04 NOTE — ED Provider Notes (Signed)
CSN: [id number]     Arrival date & time 12/04/13  1214 History   First MD Initiated Contact with Patient 12/04/13 1221     Chief Complaint  Patient presents with  . Hand Pain     (Consider location/radiation/quality/duration/timing/severity/associated sxs/prior Treatment) HPI Patient presents to the emergency department with right hand pain.  The patient, states, that he was helping his brother move when his hand got stuck between a couch and a door frame.  The patient, states, that brother kept pushing a couch, even, that he told him to stop.  Patient is having pain over the pinky region.  Patient, states the pain is worse with movement and palpation.  The patient denies nausea, vomiting, weakness, numbness, or fever.  The patient states, that he did not take any medications prior to arrival Past Medical History  Diagnosis Date  . Back pain, chronic    Past Surgical History  Procedure Laterality Date  . Skin graft      4313-89 years old. on Right index finger  . Wisdom tooth extraction     Family History  Problem Relation Age of Onset  . Diabetes Mother   . Osteoarthritis Father   . Diabetes Father   . Heart failure Maternal Aunt    History  Substance Use Topics  . Smoking status: Never Smoker   . Smokeless tobacco: Not on file  . Alcohol Use: Yes     Comment: social    Review of Systems  All other systems negative except as documented in the HPI. All pertinent positives and negatives as reviewed in the HPI.  Allergies  Asa; Tylenol; Codeine; and Penicillins  Home Medications   Current Outpatient Rx  Name  Route  Sig  Dispense  Refill  . azithromycin (ZITHROMAX Z-PAK) 250 MG tablet   Oral   Take 1 tablet (250 mg total) by mouth daily.   4 tablet   0    BP 145/85  Pulse 68  Temp(Src) 98.3 F (36.8 C) (Oral)  Resp 18  SpO2 97% Physical Exam  Constitutional: He is oriented to person, place, and time. He appears well-developed and well-nourished.  HENT:    Head: Normocephalic and atraumatic.  Pulmonary/Chest: Effort normal.  Musculoskeletal:       Right hand: He exhibits decreased range of motion, tenderness and swelling. He exhibits normal two-point discrimination, normal capillary refill, no deformity and no laceration. Normal sensation noted. Normal strength noted.       Hands: Neurological: He is alert and oriented to person, place, and time.    ED Course  Procedures (including critical care time) Imaging Review No results found.  Patient will be advised followup with orthopedics, as needed.  Patient be placed in a Velcro wrist splint told to return here as needed.  Ice and elevate the hand.    Carlyle Dollyhristopher W Deadrick Stidd, PA-C 12/04/13 1329

## 2013-12-04 NOTE — ED Notes (Signed)
Pt. Stated, I was helping move a couch and got my hand caught in between the door.

## 2013-12-04 NOTE — ED Notes (Signed)
Pt presents to department for evaluation of R hand pain. States he was helping family member move furniture and injured hand. Now states 8/10 pain. Capillary refill less than 2 seconds. Able to wiggle digits. Pt is alert and oriented x4. NAD.

## 2013-12-04 NOTE — Progress Notes (Signed)
Orthopedic Tech Progress Note Patient Details:  Curtis Vega 09/10/1991 [id number]  Ortho Devices Type of Ortho Device: Thumb velcro splint Ortho Device/Splint Location: RUE Ortho Device/Splint Interventions: Ordered;Application   Jennye MoccasinHughes, Rafferty Postlewait Craig 12/04/2013, 1:33 PM

## 2013-12-04 NOTE — Discharge Instructions (Signed)
Return here as needed. Ice and elevate your hand. Follow up with the orthopedist as needed. The x-rays were normal

## 2013-12-06 NOTE — ED Provider Notes (Signed)
Medical screening examination/treatment/procedure(s) were performed by non-physician practitioner and as supervising physician I was immediately available for consultation/collaboration.   EKG Interpretation None        Laray AngerKathleen M Keylani Perlstein, DO 12/06/13 2115

## 2014-06-15 ENCOUNTER — Emergency Department (HOSPITAL_COMMUNITY): Payer: Medicaid Other

## 2014-06-15 ENCOUNTER — Encounter (HOSPITAL_COMMUNITY): Payer: Self-pay | Admitting: Emergency Medicine

## 2014-06-15 ENCOUNTER — Emergency Department (HOSPITAL_COMMUNITY)
Admission: EM | Admit: 2014-06-15 | Discharge: 2014-06-16 | Disposition: A | Discharge status: Home / Self Care | Payer: Medicaid Other | Attending: Emergency Medicine | Admitting: Emergency Medicine

## 2014-06-15 DIAGNOSIS — R079 Chest pain, unspecified: Secondary | ICD-10-CM | POA: Insufficient documentation

## 2014-06-15 DIAGNOSIS — Z880 Allergy status to penicillin: Secondary | ICD-10-CM | POA: Insufficient documentation

## 2014-06-15 DIAGNOSIS — G8929 Other chronic pain: Secondary | ICD-10-CM | POA: Insufficient documentation

## 2014-06-15 DIAGNOSIS — Z79899 Other long term (current) drug therapy: Secondary | ICD-10-CM | POA: Insufficient documentation

## 2014-06-15 LAB — CBG MONITORING, ED: GLUCOSE-CAPILLARY: 115 mg/dL — AB (ref 70–99)

## 2014-06-15 LAB — I-STAT CHEM 8, ED
BUN: 9 mg/dL (ref 6–23)
CREATININE: 1.1 mg/dL (ref 0.50–1.35)
Calcium, Ion: 1.06 mmol/L — ABNORMAL LOW (ref 1.12–1.23)
Chloride: 118 mEq/L — ABNORMAL HIGH (ref 96–112)
Glucose, Bld: 118 mg/dL — ABNORMAL HIGH (ref 70–99)
HEMATOCRIT: 41 % (ref 39.0–52.0)
HEMOGLOBIN: 13.9 g/dL (ref 13.0–17.0)
POTASSIUM: 3.5 meq/L — AB (ref 3.7–5.3)
SODIUM: 132 meq/L — AB (ref 137–147)
TCO2: 26 mmol/L (ref 0–100)

## 2014-06-15 LAB — I-STAT TROPONIN, ED: TROPONIN I, POC: 0 ng/mL (ref 0.00–0.08)

## 2014-06-15 MED ORDER — OMEPRAZOLE 20 MG PO CPDR: 20.0000 mg | DELAYED_RELEASE_CAPSULE | Freq: Every day | ORAL | Status: DC

## 2014-06-15 NOTE — ED Provider Notes (Signed)
CSN: [id number]     Arrival date & time 06/15/14  2216 History   First MD Initiated Contact with Patient 06/15/14 2241     Chief Complaint  Patient presents with  . Chest Pain     HPI Patient presents emergent complaints of centralized chest tightness that started this morning.  He felt like he had a knot in the center of his chest.  He thought it would resolve so he ignored it most of the day.  The symptoms persisted this evening said became more concerned and decided to get checked out. The pain is not sharp. It does not radiate. He has not felt short of breath or nauseated. He has not noticed any burping or belching.  Past Medical History  Diagnosis Date  . Back pain, chronic    Past Surgical History  Procedure Laterality Date  . Skin graft      82[age] years old. on Right index finger  . Wisdom tooth extraction     Family History  Problem Relation Age of Onset  . Diabetes Mother   . Osteoarthritis Father   . Diabetes Father   . Heart failure Maternal Aunt    History  Substance Use Topics  . Smoking status: Never Smoker   . Smokeless tobacco: Not on file  . Alcohol Use: Yes     Comment: social    Review of Systems  All other systems reviewed and are negative.     Allergies  Asa; Tylenol; Codeine; and Penicillins  Home Medications   Prior to Admission medications   Medication Sig Start Date End Date Taking? Authorizing Provider  ibuprofen (ADVIL,MOTRIN) 200 MG tablet Take 200-800 mg by mouth every 6 (six) hours as needed for moderate pain.   Yes Historical Provider, MD  Iloperidone (FANAPT) 6 MG TABS Take 6 mg by mouth at bedtime.    Yes Historical Provider, MD  lamoTRIgine (LAMICTAL) 150 MG tablet Take 150 mg by mouth 2 (two) times daily.   Yes Historical Provider, MD  omeprazole (PRILOSEC) 20 MG capsule Take 1 capsule (20 mg total) by mouth daily. 06/15/14   Linwood DibblesJon Jaysiah Marchetta, MD   BP 135/67  Pulse 68  Temp(Src) 98.8 F (37.1 C) (Oral)  Resp 15  SpO2 98% Physical  Exam  Nursing note and vitals reviewed. Constitutional: He appears well-developed and well-nourished. No distress.  HENT:  Head: Normocephalic and atraumatic.  Right Ear: External ear normal.  Left Ear: External ear normal.  Eyes: Conjunctivae are normal. Right eye exhibits no discharge. Left eye exhibits no discharge. No scleral icterus.  Neck: Neck supple. No tracheal deviation present.  Cardiovascular: Normal rate, regular rhythm and intact distal pulses.   Pulmonary/Chest: Effort normal and breath sounds normal. No stridor. No respiratory distress. He has no wheezes. He has no rales.  Abdominal: Soft. Bowel sounds are normal. He exhibits no distension. There is no tenderness. There is no rebound and no guarding.  Musculoskeletal: He exhibits no edema and no tenderness.  Neurological: He is alert. He has normal strength. No cranial nerve deficit (no facial droop, extraocular movements intact, no slurred speech) or sensory deficit. He exhibits normal muscle tone. He displays no seizure activity. Coordination normal.  Skin: Skin is warm and dry. No rash noted.  Psychiatric: He has a normal mood and affect.    ED Course  Procedures (including critical care time) Labs Review Labs Reviewed  I-STAT CHEM 8, ED - Abnormal; Notable for the following:    Sodium 132 (*)  Potassium 3.5 (*)    Chloride 118 (*)    Glucose, Bld 118 (*)    Calcium, Ion 1.06 (*)    All other components within normal limits  CBG MONITORING, ED - Abnormal; Notable for the following:    Glucose-Capillary 115 (*)    All other components within normal limits  I-STAT TROPOININ, ED    Imaging Review Dg Chest 2 View  06/15/2014   CLINICAL DATA:  Chest tightness, increasing over 1 day.  Nonsmoker.  EXAM: CHEST  2 VIEW  COMPARISON:  08/11/2013  FINDINGS: The heart size and mediastinal contours are within normal limits. Both lungs are clear. The visualized skeletal structures are unremarkable.  IMPRESSION: No active  cardiopulmonary disease.   Electronically Signed   By: Burman Nieves M.D.   On: 06/15/2014 23:46     EKG Interpretation   Date/Time:  Wednesday June 15 2014 22:28:40 EDT Ventricular Rate:  64 PR Interval:  175 QRS Duration: 88 QT Interval:  386 QTC Calculation: 398 R Axis:   16 Text Interpretation:  Sinus rhythm No significant change since last  tracing Confirmed by Darold Miley  MD-J, Pearlean Sabina ([id number]) on 06/15/2014 10:47:21 PM      MDM   Final diagnoses:  Chest pain, unspecified chest pain type    The patient EKG and chest x-ray unremarkable. Low risk for PE. Perc negative.  Doubt ACS. I will discharge patient home with a prescription for antacids.  Follow up with PCP    Linwood Dibbles, MD 06/16/14 0001

## 2014-06-15 NOTE — ED Notes (Signed)
Pt complains of centralized chest tightness all day today, no other sx noted

## 2014-06-15 NOTE — ED Notes (Signed)
Pt states he has had an increase in pain since this morning. Pt denies injury to area. Pt denies taking any OTC medications for the pain. Pt denies recurrent chest pain. Pt is calm and in NAD at this time. Wife is present with the pt. Will continue to monitor. Awaiting orders from provider.

## 2014-06-15 NOTE — ED Notes (Signed)
Patient transported to X-ray 

## 2014-06-15 NOTE — Discharge Instructions (Signed)

## 2014-06-15 NOTE — ED Notes (Signed)
Entered patient room. Patient is sitting in the chair at bedside while the visitor lays in the bed. Patient advised that is feeling "dizzy". Visitor was requested to get out of the bed so the patient may lay down. Patient states "she's pregnant", patient was advised that if he passes out it will be very difficult for us to assist him out of the floor, and was requested to please have a seat in the bed. Patient declined. Patient was asked if he is diabetic, to which he replied "borderline, I was when I was a kid, but I don't take no pills or shots anymore".

## 2015-02-02 ENCOUNTER — Encounter (HOSPITAL_COMMUNITY): Payer: Self-pay | Admitting: Emergency Medicine

## 2015-02-02 ENCOUNTER — Emergency Department (HOSPITAL_COMMUNITY): Payer: Medicaid Other

## 2015-02-02 ENCOUNTER — Emergency Department (HOSPITAL_COMMUNITY)
Admission: EM | Admit: 2015-02-02 | Discharge: 2015-02-02 | Disposition: A | Discharge status: Home / Self Care | Payer: Medicaid Other | Attending: Emergency Medicine | Admitting: Emergency Medicine

## 2015-02-02 DIAGNOSIS — Y929 Unspecified place or not applicable: Secondary | ICD-10-CM | POA: Diagnosis not present

## 2015-02-02 DIAGNOSIS — S43402A Unspecified sprain of left shoulder joint, initial encounter: Secondary | ICD-10-CM | POA: Insufficient documentation

## 2015-02-02 DIAGNOSIS — G8929 Other chronic pain: Secondary | ICD-10-CM | POA: Insufficient documentation

## 2015-02-02 DIAGNOSIS — S4992XA Unspecified injury of left shoulder and upper arm, initial encounter: Secondary | ICD-10-CM | POA: Diagnosis present

## 2015-02-02 DIAGNOSIS — X58XXXA Exposure to other specified factors, initial encounter: Secondary | ICD-10-CM | POA: Diagnosis not present

## 2015-02-02 DIAGNOSIS — Y990 Civilian activity done for income or pay: Secondary | ICD-10-CM | POA: Diagnosis not present

## 2015-02-02 DIAGNOSIS — Z79899 Other long term (current) drug therapy: Secondary | ICD-10-CM | POA: Insufficient documentation

## 2015-02-02 DIAGNOSIS — Y9389 Activity, other specified: Secondary | ICD-10-CM | POA: Diagnosis not present

## 2015-02-02 DIAGNOSIS — Z880 Allergy status to penicillin: Secondary | ICD-10-CM | POA: Insufficient documentation

## 2015-02-02 MED ORDER — TRAMADOL HCL 50 MG PO TABS: 50.0000 mg | ORAL_TABLET | Freq: Four times a day (QID) | ORAL | Status: DC | PRN

## 2015-02-02 NOTE — Discharge Instructions (Signed)
Cryotherapy °Cryotherapy means treatment with cold. Ice or gel packs can be used to reduce both pain and swelling. Ice is the most helpful within the first 24 to 48 hours after an injury or flare-up from overusing a muscle or joint. Sprains, strains, spasms, burning pain, shooting pain, and aches can all be eased with ice. Ice can also be used when recovering from surgery. Ice is effective, has very few side effects, and is safe for most people to use. °PRECAUTIONS  °Ice is not a safe treatment option for people with: °· Raynaud phenomenon. This is a condition affecting small blood vessels in the extremities. Exposure to cold may cause your problems to return. °· Cold hypersensitivity. There are many forms of cold hypersensitivity, including: °· Cold urticaria. Red, itchy hives appear on the skin when the tissues begin to warm after being iced. °· Cold erythema. This is a red, itchy rash caused by exposure to cold. °· Cold hemoglobinuria. Red blood cells break down when the tissues begin to warm after being iced. The hemoglobin that carry oxygen are passed into the urine because they cannot combine with blood proteins fast enough. °· Numbness or altered sensitivity in the area being iced. °If you have any of the following conditions, do not use ice until you have discussed cryotherapy with your caregiver: °· Heart conditions, such as arrhythmia, angina, or chronic heart disease. °· High blood pressure. °· Healing wounds or open skin in the area being iced. °· Current infections. °· Rheumatoid arthritis. °· Poor circulation. °· Diabetes. °Ice slows the blood flow in the region it is applied. This is beneficial when trying to stop inflamed tissues from spreading irritating chemicals to surrounding tissues. However, if you expose your skin to cold temperatures for too long or without the proper protection, you can damage your skin or nerves. Watch for signs of skin damage due to cold. °HOME CARE INSTRUCTIONS °Follow  these tips to use ice and cold packs safely. °· Place a dry or damp towel between the ice and skin. A damp towel will cool the skin more quickly, so you may need to shorten the time that the ice is used. °· For a more rapid response, add gentle compression to the ice. °· Ice for no more than 10 to 20 minutes at a time. The bonier the area you are icing, the less time it will take to get the benefits of ice. °· Check your skin after 5 minutes to make sure there are no signs of a poor response to cold or skin damage. °· Rest 20 minutes or more between uses. °· Once your skin is numb, you can end your treatment. You can test numbness by very lightly touching your skin. The touch should be so light that you do not see the skin dimple from the pressure of your fingertip. When using ice, most people will feel these normal sensations in this order: cold, burning, aching, and numbness. °· Do not use ice on someone who cannot communicate their responses to pain, such as small children or people with dementia. °HOW TO MAKE AN ICE PACK °Ice packs are the most common way to use ice therapy. Other methods include ice massage, ice baths, and cryosprays. Muscle creams that cause a cold, tingly feeling do not offer the same benefits that ice offers and should not be used as a substitute unless recommended by your caregiver. °To make an ice pack, do one of the following: °· Place crushed ice or a   bag of frozen vegetables in a sealable plastic bag. Squeeze out the excess air. Place this bag inside another plastic bag. Slide the bag into a pillowcase or place a damp towel between your skin and the bag. °· Mix 3 parts water with 1 part rubbing alcohol. Freeze the mixture in a sealable plastic bag. When you remove the mixture from the freezer, it will be slushy. Squeeze out the excess air. Place this bag inside another plastic bag. Slide the bag into a pillowcase or place a damp towel between your skin and the bag. °SEEK MEDICAL CARE  IF: °· You develop white spots on your skin. This may give the skin a blotchy (mottled) appearance. °· Your skin turns blue or pale. °· Your skin becomes waxy or hard. °· Your swelling gets worse. °MAKE SURE YOU:  °· Understand these instructions. °· Will watch your condition. °· Will get help right away if you are not doing well or get worse. °Document Released: 04/29/2011 Document Revised: 01/17/2014 Document Reviewed: 04/29/2011 °ExitCare® Patient Information ©2015 [employer]. This information is not intended to replace advice given to you by your health care provider. Make sure you discuss any questions you have with your health care provider. ° °Joint Sprain °A sprain is a tear or stretch in the ligaments that hold a joint together. Severe sprains may need as long as 3-6 weeks of immobilization and/or exercises to heal completely. Sprained joints should be rested and protected. If not, they can become unstable and prone to re-injury. Proper treatment can reduce your pain, shorten the period of disability, and reduce the risk of repeated injuries. °TREATMENT  °· Rest and elevate the injured joint to reduce pain and swelling. °· Apply ice packs to the injury for 20-30 minutes every 2-3 hours for the next 2-3 days. °· Keep the injury wrapped in a compression bandage or splint as long as the joint is painful or as instructed by your caregiver. °· Do not use the injured joint until it is completely healed to prevent re-injury and chronic instability. Follow the instructions of your caregiver. °· Long-term sprain management may require exercises and/or treatment by a physical therapist. Taping or special braces may help stabilize the joint until it is completely better. °SEEK MEDICAL CARE IF:  °· You develop increased pain or swelling of the joint. °· You develop increasing redness and warmth of the joint. °· You develop a fever. °· It becomes stiff. °· Your hand or foot gets cold or numb. °Document Released:  10/10/2004 Document Revised: 11/25/2011 Document Reviewed: 09/19/2008 °ExitCare® Patient Information ©2015 [employer]. This information is not intended to replace advice given to you by your health care provider. Make sure you discuss any questions you have with your health care provider. ° °

## 2015-02-02 NOTE — ED Notes (Signed)
Patient returned from X-ray 

## 2015-02-02 NOTE — ED Notes (Signed)
PA at the bedside.

## 2015-02-02 NOTE — ED Provider Notes (Signed)
CSN: [id number]     Arrival date & time 02/02/15  1212 History   This chart was scribed for non-physician practitioner working, Arthor CaptainAbigail Rendy Lazard, PA-C, with Benjiman CoreNathan Pickering, MD, by Modena JanskyAlbert Thayil, ED Scribe. This patient was seen in room TR10C/TR10C and the patient's care was started at 1:38 PM.   Chief Complaint  Patient presents with  . Shoulder Pain   The history is provided by the patient. No language interpreter was used.   HPI Comments: Georgianne FickJirian Dobberstein is a 24 y.o. male who presents to the Emergency Department complaining of constant moderate left shoulder pain that started yesterday. He reports that his left shoulder "locked up" while he was lifting a box at work yesterday. He states that the pain worsened today, and describes the pain as a throbbing and burning sensation. He reports that he took some ibuprofen with some relief today. He states that movement his LUE exacerbates the pain. He denies any known fever.   Past Medical History  Diagnosis Date  . Back pain, chronic    Past Surgical History  Procedure Laterality Date  . Skin graft      7013-24 years old. on Right index finger  . Wisdom tooth extraction     Family History  Problem Relation Age of Onset  . Diabetes Mother   . Osteoarthritis Father   . Diabetes Father   . Heart failure Maternal Aunt    History  Substance Use Topics  . Smoking status: Never Smoker   . Smokeless tobacco: Not on file  . Alcohol Use: Yes     Comment: social    Review of Systems  Constitutional: Negative for fever.  Musculoskeletal: Positive for myalgias.   Allergies  Asa; Tylenol; Codeine; and Penicillins  Home Medications   Prior to Admission medications   Medication Sig Start Date End Date Taking? Authorizing Provider  ibuprofen (ADVIL,MOTRIN) 200 MG tablet Take 200-800 mg by mouth every 6 (six) hours as needed for moderate pain.    Historical Provider, MD  Iloperidone (FANAPT) 6 MG TABS Take 6 mg by mouth at bedtime.     Historical  Provider, MD  lamoTRIgine (LAMICTAL) 150 MG tablet Take 150 mg by mouth 2 (two) times daily.    Historical Provider, MD  omeprazole (PRILOSEC) 20 MG capsule Take 1 capsule (20 mg total) by mouth daily. 06/15/14   Linwood DibblesJon Knapp, MD   BP 143/82 mmHg  Pulse 72  Temp(Src) 97.8 F (36.6 C) (Oral)  Resp 16  Ht 5\' 11"  (1.803 m)  Wt 365 lb (165.563 kg)  BMI 50.93 kg/m2  SpO2 98% Physical Exam  Constitutional: He is oriented to person, place, and time. He appears well-developed and well-nourished. No distress.  HENT:  Head: Normocephalic and atraumatic.  Neck: Neck supple. No tracheal deviation present.  Cardiovascular: Normal rate.   Pulmonary/Chest: Effort normal. No respiratory distress.  Musculoskeletal: Normal range of motion.  Full ROM. Pain with extension and abduction. TTP at the corticoid process and infraspinatus and subscapulares. NV intact.   Neurological: He is alert and oriented to person, place, and time.  Skin: Skin is warm and dry.  Psychiatric: He has a normal mood and affect. His behavior is normal.  Nursing note and vitals reviewed.   ED Course  Procedures (including critical care time) DIAGNOSTIC STUDIES: Oxygen Saturation is 98% on RA, normal by my interpretation.    COORDINATION OF CARE: 1:43 PM- Pt advised of plan for treatment which includes radiology and pt agrees.  Labs Review  Labs Reviewed - No data to display  Imaging Review Dg Shoulder Left  02/02/2015   CLINICAL DATA:  LEFT shoulder pain.  Lifting injury.  EXAM: LEFT SHOULDER - 2+ VIEW  COMPARISON:  None.  FINDINGS: There is no evidence of fracture or dislocation. There is no evidence of arthropathy or other focal bone abnormality. Soft tissues are unremarkable.  IMPRESSION: Negative.   Electronically Signed   By: Andreas NewportGeoffrey  Lamke M.D.   On: 02/02/2015 13:15     EKG Interpretation None      MDM   Final diagnoses:  Shoulder sprain, left, initial encounter    Patient X-Ray negative for obvious  fracture or dislocation. Pain managed in ED. Pt advised to follow up with orthopedics if symptoms persist for possibility of missed fracture diagnosis. Patient given brace while in ED, conservative therapy recommended and discussed. Patient will be dc home & is agreeable with above plan.   I personally performed the services described in this documentation, which was scribed in my presence. The recorded information has been reviewed and is accurate.      Arthor Captainbigail Dewell Monnier, PA-C 02/14/15 2033  Benjiman CoreNathan Pickering, MD 02/15/15 Marlyne Beards0002

## 2015-02-02 NOTE — ED Notes (Signed)
Pt c/o left shoulder pain since lifting a box last night at work. Pain worse with abduction.

## 2015-09-10 ENCOUNTER — Encounter (HOSPITAL_COMMUNITY): Payer: Self-pay | Admitting: *Deleted

## 2015-09-10 ENCOUNTER — Emergency Department (HOSPITAL_COMMUNITY)
Admission: EM | Admit: 2015-09-10 | Discharge: 2015-09-10 | Disposition: A | Discharge status: Home / Self Care | Payer: Medicaid Other | Attending: Emergency Medicine | Admitting: Emergency Medicine

## 2015-09-10 DIAGNOSIS — Z880 Allergy status to penicillin: Secondary | ICD-10-CM | POA: Insufficient documentation

## 2015-09-10 DIAGNOSIS — R1110 Vomiting, unspecified: Secondary | ICD-10-CM | POA: Insufficient documentation

## 2015-09-10 DIAGNOSIS — R05 Cough: Secondary | ICD-10-CM | POA: Insufficient documentation

## 2015-09-10 DIAGNOSIS — H578 Other specified disorders of eye and adnexa: Secondary | ICD-10-CM | POA: Insufficient documentation

## 2015-09-10 DIAGNOSIS — G8929 Other chronic pain: Secondary | ICD-10-CM | POA: Diagnosis not present

## 2015-09-10 DIAGNOSIS — J029 Acute pharyngitis, unspecified: Secondary | ICD-10-CM | POA: Diagnosis not present

## 2015-09-10 DIAGNOSIS — Z79899 Other long term (current) drug therapy: Secondary | ICD-10-CM | POA: Diagnosis not present

## 2015-09-10 DIAGNOSIS — R059 Cough, unspecified: Secondary | ICD-10-CM

## 2015-09-10 MED ORDER — BENZONATATE 100 MG PO CAPS: 100.0000 mg | ORAL_CAPSULE | Freq: Three times a day (TID) | ORAL | Status: DC

## 2015-09-10 MED ORDER — AEROCHAMBER PLUS FLO-VU MEDIUM MISC
1.0000 | Freq: Once | Status: AC
  Administered 2015-09-10: 1
  Filled 2015-09-10: qty 1

## 2015-09-10 MED ORDER — ALBUTEROL SULFATE HFA 108 (90 BASE) MCG/ACT IN AERS
2.0000 | INHALATION_SPRAY | RESPIRATORY_TRACT | Status: DC | PRN
  Administered 2015-09-10: 2 via RESPIRATORY_TRACT
  Filled 2015-09-10: qty 6.7

## 2015-09-10 MED ORDER — BENZONATATE 100 MG PO CAPS
100.0000 mg | ORAL_CAPSULE | Freq: Once | ORAL | Status: AC
  Administered 2015-09-10: 100 mg via ORAL
  Filled 2015-09-10: qty 1

## 2015-09-10 NOTE — ED Provider Notes (Signed)
CSN: [id number]     Arrival date & time 09/10/15  0908 History  By signing my name below, I, Octavia Heirrianna Nassar, attest that this documentation has been prepared under the direction and in the presence of TXU CorpHannah Sandon Yoho, PA-C. Electronically Signed: Octavia HeirArianna Nassar, ED Scribe. 09/10/2015. 9:38 AM.     Chief Complaint  Patient presents with  . Cough     The history is provided by the patient and medical records.   HPI Comments: Curtis Vega is a 24 y.o. male who presents to the Emergency Depertment complaining of constant, gradual worsening cough that is sometimes productive with yellow phlegm onset 2 weeks ago. He reports having associated "popped" blood vessels in both eyes, mild sore throat, and one episode of vomiting yesterday all secondary to cough. Pt reports having a viral infection a few weeks ago which he was seen by the ED and was told it would alleviate but it has not. He has taken Robitussin, cough drops and minimal vodka to alleviate the symptoms with no relief. Pt denies fevers, chills, nausea, abdominal pain, sick contacts, shortness of breath and diarrhea. Pt is a non-smoker.  Past Medical History  Diagnosis Date  . Back pain, chronic    Past Surgical History  Procedure Laterality Date  . Skin graft      2113-24 years old. on Right index finger  . Wisdom tooth extraction     Family History  Problem Relation Age of Onset  . Diabetes Mother   . Osteoarthritis Father   . Diabetes Father   . Heart failure Maternal Aunt    Social History  Substance Use Topics  . Smoking status: Never Smoker   . Smokeless tobacco: None  . Alcohol Use: Yes     Comment: social    Review of Systems  Constitutional: Negative for fever.  HENT: Positive for sore throat (secondary to cough). Negative for congestion, ear pain, nosebleeds, postnasal drip, rhinorrhea and sinus pressure.   Eyes: Positive for redness. Negative for photophobia, pain, discharge, itching and visual disturbance.   Respiratory: Positive for cough. Negative for chest tightness, shortness of breath and wheezing.   Cardiovascular: Negative for chest pain.  Gastrointestinal: Positive for vomiting (secondary to cough). Negative for nausea, abdominal pain and diarrhea.  Endocrine: Negative for polydipsia, polyphagia and polyuria.  Musculoskeletal: Negative for back pain.  Skin: Negative for rash.  Allergic/Immunologic: Negative for immunocompromised state.  Neurological: Negative for headaches.      Allergies  Asa; Tylenol; Codeine; and Penicillins  Home Medications   Prior to Admission medications   Medication Sig Start Date End Date Taking? Authorizing Provider  ibuprofen (ADVIL,MOTRIN) 200 MG tablet Take 200-800 mg by mouth every 6 (six) hours as needed for moderate pain.   Yes Historical Provider, MD  Iloperidone (FANAPT) 6 MG TABS Take 6 mg by mouth at bedtime.    Yes Historical Provider, MD  lamoTRIgine (LAMICTAL) 150 MG tablet Take 150 mg by mouth 2 (two) times daily.   Yes Historical Provider, MD  traMADol (ULTRAM) 50 MG tablet Take 1 tablet (50 mg total) by mouth every 6 (six) hours as needed. 02/02/15  Yes Arthor CaptainAbigail Harris, PA-C  benzonatate (TESSALON) 100 MG capsule Take 1 capsule (100 mg total) by mouth every 8 (eight) hours. 09/10/15   Mylasia Vorhees, PA-C  omeprazole (PRILOSEC) 20 MG capsule Take 1 capsule (20 mg total) by mouth daily. 06/15/14   Linwood DibblesJon Knapp, MD   Triage vitals: BP 168/99 mmHg  Pulse 80  Temp(Src) 98.3  F (36.8 C) (Oral)  Resp 22  SpO2 98% Physical Exam  Constitutional: He is oriented to person, place, and time. He appears well-developed and well-nourished. No distress.  HENT:  Head: Normocephalic and atraumatic.  Right Ear: Tympanic membrane, external ear and ear canal normal.  Left Ear: Tympanic membrane, external ear and ear canal normal.  Nose: No mucosal edema or rhinorrhea. No epistaxis. Right sinus exhibits no maxillary sinus tenderness and no frontal sinus  tenderness. Left sinus exhibits no maxillary sinus tenderness and no frontal sinus tenderness.  Mouth/Throat: Uvula is midline, oropharynx is clear and moist and mucous membranes are normal. Mucous membranes are not pale and not cyanotic. No oropharyngeal exudate, posterior oropharyngeal edema, posterior oropharyngeal erythema or tonsillar abscesses.  Eyes: Conjunctivae and EOM are normal. Pupils are equal, round, and reactive to light.  Bilateral subconjunctival hemorrhages Full EOMs PERRL  Neck: Normal range of motion and full passive range of motion without pain.  Cardiovascular: Normal rate, normal heart sounds and intact distal pulses.   Pulmonary/Chest: Effort normal and breath sounds normal. No stridor.  Slightly diminished but equal breath sounds without focal wheezes, rhonchi or rales  Abdominal: Soft. Bowel sounds are normal. There is no tenderness.  Musculoskeletal: Normal range of motion.  Lymphadenopathy:    He has no cervical adenopathy.  Neurological: He is alert and oriented to person, place, and time.  Skin: Skin is warm and dry. No rash noted. He is not diaphoretic.  Psychiatric: He has a normal mood and affect.  Nursing note and vitals reviewed.   ED Course  Procedures  DIAGNOSTIC STUDIES: Oxygen Saturation is 98% on RA, normal by my interpretation.  COORDINATION OF CARE:  9:26 AM Discussed treatment plan which includes inhaler and Tessalon with pt at bedside and pt agreed to plan.  MDM   Final diagnoses:  Cough   Pt resents to the Emergency Department for persistent cough lasting two weeks. Doubt PNA, no indication for CXR at this time.  Pt is afebrile, witout tachycardia or hypotension.  No focal breath sounds.  No reported SOB. Pt will receive Tessalon and inhaler upon discharge. Pt will be discharged with symptomatic treatment.  Discussed return precautions.  Pt is hemodynamically stable & in NAD prior to discharge.   I personally performed the services  described in this documentation, which was scribed in my presence. The recorded information has been reviewed and is accurate.   Dahlia Client Terease Marcotte, PA-C 09/10/15 1018  Donnetta Hutching, MD 09/10/15 1029

## 2015-09-10 NOTE — ED Notes (Signed)
Pt also reports his eyes are red but denies any drainage

## 2015-09-10 NOTE — ED Notes (Signed)
Pt reports cough for 2 weeks.

## 2015-09-10 NOTE — ED Notes (Signed)
Declined W/C at D/C and was escorted to lobby by RN. 

## 2015-09-10 NOTE — Discharge Instructions (Signed)
1. Medications: albuterol, tessalon, usual home medications 2. Treatment: rest, drink plenty of fluids, take tylenol or ibuprofen for fever control 3. Follow Up: Please followup with your primary doctor in 3 days for discussion of your diagnoses and further evaluation after today's visit; if you do not have a primary care doctor use the resource guide provided to find one; Return to the ER for high fevers, difficulty breathing or other concerning symptoms    Cough, Adult Coughing is a reflex that clears your throat and your airways. Coughing helps to heal and protect your lungs. It is normal to cough occasionally, but a cough that happens with other symptoms or lasts a long time may be a sign of a condition that needs treatment. A cough may last only 2-3 weeks (acute), or it may last longer than 8 weeks (chronic). CAUSES Coughing is commonly caused by:  Breathing in substances that irritate your lungs.  A viral or bacterial respiratory infection.  Allergies.  Asthma.  Postnasal drip.  Smoking.  Acid backing up from the stomach into the esophagus (gastroesophageal reflux).  Certain medicines.  Chronic lung problems, including COPD (or rarely, lung cancer).  Other medical conditions such as heart failure. HOME CARE INSTRUCTIONS  Pay attention to any changes in your symptoms. Take these actions to help with your discomfort:  Take medicines only as told by your health care provider.  If you were prescribed an antibiotic medicine, take it as told by your health care provider. Do not stop taking the antibiotic even if you start to feel better.  Talk with your health care provider before you take a cough suppressant medicine.  Drink enough fluid to keep your urine clear or pale yellow.  If the air is dry, use a cold steam vaporizer or humidifier in your bedroom or your home to help loosen secretions.  Avoid anything that causes you to cough at work or at home.  If your cough is  worse at night, try sleeping in a semi-upright position.  Avoid cigarette smoke. If you smoke, quit smoking. If you need help quitting, ask your health care provider.  Avoid caffeine.  Avoid alcohol.  Rest as needed. SEEK MEDICAL CARE IF:   You have new symptoms.  You cough up pus.  Your cough does not get better after 2-3 weeks, or your cough gets worse.  You cannot control your cough with suppressant medicines and you are losing sleep.  You develop pain that is getting worse or pain that is not controlled with pain medicines.  You have a fever.  You have unexplained weight loss.  You have night sweats. SEEK IMMEDIATE MEDICAL CARE IF:  You cough up blood.  You have difficulty breathing.  Your heartbeat is very fast.   This information is not intended to replace advice given to you by your health care provider. Make sure you discuss any questions you have with your health care provider.   Document Released: 03/01/2011 Document Revised: 05/24/2015 Document Reviewed: 11/09/2014 Elsevier Interactive Patient Education 2016 [employer].    Emergency Department Resource Guide 1) Find a Doctor and Pay Out of Pocket Although you won't have to find out who is covered by your insurance plan, it is a good idea to ask around and get recommendations. You will then need to call the office and see if the doctor you have chosen will accept you as a new patient and what types of options they offer for patients who are self-pay. Some doctors offer  discounts or will set up payment plans for their patients who do not have insurance, but you will need to ask so you aren't surprised when you get to your appointment.  2) Contact Your Local Health Department Not all health departments have doctors that can see patients for sick visits, but many do, so it is worth a call to see if yours does. If you don't know where your local health department is, you can check in your phone book. The CDC  also has a tool to help you locate your state's health department, and many state websites also have listings of all of their local health departments.  3) Find a Walk-in Clinic If your illness is not likely to be very severe or complicated, you may want to try a walk in clinic. These are popping up all over the country in pharmacies, drugstores, and shopping centers. They're usually staffed by nurse practitioners or physician assistants that have been trained to treat common illnesses and complaints. They're usually fairly quick and inexpensive. However, if you have serious medical issues or chronic medical problems, these are probably not your best option.  No Primary Care Doctor: - Call Health Connect at  [phone] - they can help you locate a primary care doctor that  accepts your insurance, provides certain services, etc. - Physician Referral Service- [phone]  Chronic Pain Problems: Organization         Address  Phone   Notes  [facility]  [phone]) [phone] Patients need to be referred by their primary care doctor.   Medication Assistance: Organization         Address  Phone   Notes  [facility] Medication [facility] [address] [place], [place] [address] [phone]) [phone] --Must be a resident of [facility] -- Must have NO insurance coverage whatsoever (no Medicaid/ Medicare, etc.) -- The pt. MUST have a primary care doctor that directs their care regularly and follows them in the community   Med[facility]ist  [phone]   [employer]  [phone]    Agencies that provide inexpensive medical care: Organization         Address  Phone   Notes  [facility]  [phone]) [phone]   [facility]    [phone]) [phone]   [facility] [address] [place], [place] [address] [phone]) [phone]   [facility] [address], [place] [phone]   [facility]anned Parenthood    [phone]  [phone]   [facility]    [phone]) [phone]   [facility]  [address], [place] Phone:  [phone](336) [phone], Fax:  [phone] Hours of Operation:  9 am - 6 pm, M-F.  Also accepts Medicaid/Medicare and self-pay.  [facility]  [address], [place] Phone: [phone]) [phone], Fax: [phone]. Hours of Operation:  8:30 am - 5:30 pm, M-F.  Also accepts Medicaid and self-pay.  [facility] [place] [address], [place] Phone: [phone](336) [phone]   [facility] [address], [place], [place] [phone], Ext. [phone] Mondays & Thursdays: 7-9 AM.  First 15 patients are seen on a first come, first serve basis.    Medicaid-accepting [facility] Providers:  Organization         Address  Phone   Notes  [facility] [address],  [place] [phone] Also accepts self-pay patients.  [facility] [address], [place]  [phone]   [place], [address], [place] [phone]   [facility] [address], [place] [phone]   Lucianne Lei [address], [place]   [phone] Only accepts [place] Access [place] patients after they have their name applied to their card.   Self-Pay (no insurance) in [facility]:  Organization         Address  Phone   Notes  Sickle Cell Patients, [facility] [place] [phone]   [facility] [place] [phone]   [facility] [place]  [place], [place], [place] [phone]   [facility]/Dr. Osei-Bonsu  [address], [place] or [address], [place] [phone] Phone number for both [place] and [place] locations is the same.  [facility] [address], [place] [phone]   [facility] [address] [place] or [address] [phone]   [facility] [address] [place] [phone], phone; [phone], fax Sees patients 1st and 3rd Saturday of every month.  Must not qualify for public or private insurance (i.e. Medicaid, Medicare, [place] Health Choice, Veterans' Benefits)  Household income should be no more than 200% of the poverty level The clinic cannot treat you if you are pregnant or think you are pregnant  Sexually transmitted diseases are not treated at the clinic.    Dental Care: Organization         Address  Phone  Notes  [facility] Department of [facility] [place] [phone] Accepts children up to age 73 who are enrolled in [place] or [place]; pregnant women with a Medicaid card; and children who have applied for Medicaid or [place] Health Choice, but were declined, whose parents can pay a reduced fee at time of service.  [facility] Department of [facility]  [address], [place] [phone] Accepts children up to age 21 who are enrolled in [place] or [place]; pregnant women with a Medicaid card; and children who have applied for Medicaid or [place] Health Choice, but were declined, whose parents can pay a reduced fee at time of service.  [facility]  [place] [phone] Patients are seen by appointment only. Walk-ins are not accepted. [place] will see patients 67 years of age and older. Monday - Tuesday (8am-5pm) Most Wednesdays (8:30-5pm) $30 per visit, cash only  [facility] Adult Dental Access PROGRAM  [address], [facility] [phone] Patients are seen by appointment only. Walk-ins are not accepted. [place] will see patients 52 years of age and older. One  Wednesday Evening (Monthly: Volunteer Based).  $30 per visit, cash only  [place]  [phone] for adults; Children under age 37, call Graduate Pediatric Dentistry at [phone]. Children aged 82-14, please call [phone] to request a pediatric application.  Dental services are provided in all areas of dental care including fillings, crowns and bridges, complete and partial dentures, implants, gum treatment, root canals, and extractions. Preventive care is  also provided. Treatment is provided to both adults and children. Patients are selected via a lottery and there is often a waiting list.   [facility] [address], [place]  [phone] [email or web address]   [facility] [address] [place], [place] [phone], Ext. [phone] Second and Fourth Thursday of each month, opens at 6:30 AM; Clinic ends at 9 AM.  Patients are seen on a first-come first-served basis, and a limited number are seen during each clinic.   [facility]  [address] Hillard Danker [place], [place] [phone]   Eligibility Requirements You must have lived in [place], [place], or [place] counties for at least the last three months.   You cannot be eligible for state or federal sponsored [employer], including [employer], [place], or [employer].   You generally cannot be eligible for healthcare insurance through your employer.    How to apply: Eligibility screenings are held every Tuesday and Wednesday afternoon from 1:00 pm until 4:00 pm. You do not need an appointment for the interview!  [facility] [address], [place], [place]   [place]  [place] Department  [place]  [phone]    Behavioral Health Resources in the Community: Intensive Outpatient Programs Organization          Address  Phone  Notes  [place]. [address], [place], [place] [phone]   [facility] [address], [place], [place]   ADS: [facility] [address], [place], [place]   [place] [address]  [place], [place] or [phone]   Substance Abuse Resources Organization         Address  Phone  Notes  Alcohol and Drug Services  [phone]   [place]  [phone]   [facility] [place]   Chinita Pester  [phone]   Residential & Outpatient Substance Abuse Program  [phone]   Psychological Services Organization         Address  Phone  Notes  [facility] [place]  [place]  [phone]   [place] [address] [place] or [phone]    Mobile Crisis Teams Organization         Address  Phone  Notes  [facility], Mobile Crisis Care Unit  [phone]   [facility]  [address] [place], [place]   [place] Levels [address], [place] [phone]    Self-Help/Support Groups Organization         Address  Phone             Notes  [place] of [place] - variety of support groups  [place] Call for more information  Narcotics Anonymous (NA), [facility] [address], [employer] [place]  2 meetings at this location   Special educational needs teacher         Address  Phone  Notes  [facility] [place],    [place]  [phone]   [facility]  [address] [place] [address]33, [place], [place]   [place], [place] [phone] Admissions: 8am-3pm M-F  Incentives Substance [place] [address]  [place], [place]   [facility] [address] Jadene Pierini [place], [place]   The [place].,  [place], [place]   [facility] - Intensive Outpatient [address], [place], [place]   [facility] ([place] [address]  [place], [place] [phone] or [phone]   [facility] (RTS) [address] [place], [place] Accepts Medicaid  [facility] [place] [address],  [place] [phone] Substance Abuse/Addiction Treatment   [facility] Organization         Address  Phone  Notes  [facility]  [phone]   Domenic Schwab, PhD [address] Arlis Porta [place], [place]   [phone] or [phone]   [place], [place] [phone]   [facility] [address] [place], [place] [phone] Insurance/Medicaid/sponsorship through [facility] and Families [address] [place]                                    [place], [place] [phone] [place] [address], [place] [phone]    Dr. Adele Schilder  [phone]   [facility] Dept. 1) [address] [place] 2) [place] 3)  [place] [address], [place] [phone]  [phone]   [place] [phone] or [phone] (After Hours)

## 2015-10-09 ENCOUNTER — Encounter (HOSPITAL_COMMUNITY): Payer: Self-pay | Admitting: Emergency Medicine

## 2015-10-09 ENCOUNTER — Emergency Department (HOSPITAL_COMMUNITY): Payer: Medicaid Other

## 2015-10-09 ENCOUNTER — Emergency Department (HOSPITAL_COMMUNITY)
Admission: EM | Admit: 2015-10-09 | Discharge: 2015-10-10 | Disposition: A | Discharge status: Home / Self Care | Payer: Medicaid Other | Attending: Emergency Medicine | Admitting: Emergency Medicine

## 2015-10-09 DIAGNOSIS — Z79899 Other long term (current) drug therapy: Secondary | ICD-10-CM | POA: Insufficient documentation

## 2015-10-09 DIAGNOSIS — J209 Acute bronchitis, unspecified: Secondary | ICD-10-CM | POA: Insufficient documentation

## 2015-10-09 DIAGNOSIS — G8929 Other chronic pain: Secondary | ICD-10-CM | POA: Diagnosis not present

## 2015-10-09 DIAGNOSIS — R05 Cough: Secondary | ICD-10-CM | POA: Diagnosis present

## 2015-10-09 DIAGNOSIS — J208 Acute bronchitis due to other specified organisms: Secondary | ICD-10-CM

## 2015-10-09 DIAGNOSIS — Z880 Allergy status to penicillin: Secondary | ICD-10-CM | POA: Insufficient documentation

## 2015-10-09 NOTE — ED Provider Notes (Signed)
CSN: [id number]     Arrival date & time 10/09/15  1857 History  By signing my name below, I, Budd Palmer, attest that this documentation has been prepared under the direction and in the presence of Paula Libra, MD. Electronically Signed: Budd Palmer, ED Scribe. 10/10/2015. 12:02 AM.     Chief Complaint  Patient presents with  . Cough   The history is provided by the patient. No language interpreter was used.   HPI Comments: Curtis Vega is a 24 y.o. male who presents to the Emergency Department complaining of productive cough onset 2 weeks ago. The cough is been severe enough to cause post-tussive emesis (2x). He was seen for a similar illness in December. At the time, he was given tessalon pearls, as well as an inhaler which he has since used up. He has tried taking Mucinex tonight with moderate relief. Pt denies fever, chills, abdominal pain and diarrhea.   Past Medical History  Diagnosis Date  . Back pain, chronic    Past Surgical History  Procedure Laterality Date  . Skin graft      60-14 years old. on Right index finger  . Wisdom tooth extraction     Family History  Problem Relation Age of Onset  . Diabetes Mother   . Osteoarthritis Father   . Diabetes Father   . Heart failure Maternal Aunt    Social History  Substance Use Topics  . Smoking status: Never Smoker   . Smokeless tobacco: None  . Alcohol Use: Yes     Comment: social    Review of Systems  All other systems reviewed and are negative.   Allergies  Asa; Tylenol; Codeine; and Penicillins  Home Medications   Prior to Admission medications   Medication Sig Start Date End Date Taking? Authorizing Provider  DULoxetine (CYMBALTA) 20 MG capsule Take 20 mg by mouth every evening.    Yes Historical Provider, MD  ibuprofen (ADVIL,MOTRIN) 200 MG tablet Take 200-800 mg by mouth every 6 (six) hours as needed for moderate pain.   Yes Historical Provider, MD  Iloperidone (FANAPT) 6 MG TABS Take 6 mg by mouth 2  (two) times daily.    Yes Historical Provider, MD  lamoTRIgine (LAMICTAL) 25 MG tablet Take 25 mg by mouth 2 (two) times daily.   Yes Historical Provider, MD  benzonatate (TESSALON) 100 MG capsule Take 1 capsule (100 mg total) by mouth every 8 (eight) hours. Patient not taking: Reported on 10/09/2015 09/10/15   Dahlia Client Muthersbaugh, PA-C   BP 161/125 mmHg  Pulse 102  Temp(Src) 98.9 F (37.2 C) (Oral)  Resp 18  SpO2 96% Physical Exam General: Well-developed, well-nourished male in no acute distress; appearance consistent with age of record HENT: normocephalic; atraumatic; pharynx normal Eyes: pupils equal, round and reactive to light; extraocular muscles intact Neck: supple Heart: regular rate and rhythm Lungs: clear to auscultation bilaterally Abdomen: soft; nondistended Extremities: No deformity; full range of motion Neurologic: Awake, alert and oriented; motor function intact in all extremities and symmetric; no facial droop Skin: Warm and dry Psychiatric: Normal mood and affect   ED Course  Procedures   MDM  Nursing notes and vitals signs, including pulse oximetry, reviewed.  Summary of this visit's results, reviewed by myself:  Imaging Studies: Dg Chest 2 View  10/09/2015  CLINICAL DATA:  Pt complains of mid chest pain, productive cough, some sob, and sweats x 2 weeks. Shielded pt. EXAM: CHEST  2 VIEW COMPARISON:  06/15/2014 FINDINGS: Cardiomediastinal silhouette is  within normal limits. The lungs are free of focal consolidations and pleural effusions. No pulmonary edema. IMPRESSION: No active cardiopulmonary disease. Electronically Signed   By: Norva Pavlov M.D.   On: 10/09/2015 19:55     Final diagnoses:  Acute viral bronchitis   I personally performed the services described in this documentation, which was scribed in my presence. The recorded information has been reviewed and is accurate.   Paula Libra, MD 10/10/15 [phone]

## 2015-10-09 NOTE — ED Notes (Signed)
Patient presents for productive cough, emesis x2 episodes, HA x2 weeks. Denies fever/chills, denies abdominal pain/diarrhea.

## 2015-10-10 MED ORDER — HYDROCOD POLST-CPM POLST ER 10-8 MG/5ML PO SUER: 5.0000 mL | Freq: Two times a day (BID) | ORAL | Status: AC | PRN

## 2015-10-10 MED ORDER — ALBUTEROL SULFATE HFA 108 (90 BASE) MCG/ACT IN AERS
2.0000 | INHALATION_SPRAY | RESPIRATORY_TRACT | Status: DC | PRN
  Administered 2015-10-10: 2 via RESPIRATORY_TRACT
  Filled 2015-10-10: qty 6.7

## 2015-10-10 NOTE — Discharge Instructions (Signed)

## 2016-02-25 ENCOUNTER — Emergency Department (HOSPITAL_COMMUNITY): Payer: Medicaid Other

## 2016-02-25 ENCOUNTER — Emergency Department (HOSPITAL_COMMUNITY)
Admission: EM | Admit: 2016-02-25 | Discharge: 2016-02-25 | Disposition: A | Discharge status: Home / Self Care | Payer: Medicaid Other | Attending: Emergency Medicine | Admitting: Emergency Medicine

## 2016-02-25 ENCOUNTER — Encounter (HOSPITAL_COMMUNITY): Payer: Self-pay | Admitting: Emergency Medicine

## 2016-02-25 DIAGNOSIS — M79672 Pain in left foot: Secondary | ICD-10-CM | POA: Diagnosis present

## 2016-02-25 MED ORDER — IBUPROFEN 200 MG PO TABS
600.0000 mg | ORAL_TABLET | Freq: Once | ORAL | Status: AC
  Administered 2016-02-25: 600 mg via ORAL
  Filled 2016-02-25: qty 3

## 2016-02-25 MED ORDER — ACETAMINOPHEN 500 MG PO TABS
1000.0000 mg | ORAL_TABLET | Freq: Once | ORAL | Status: DC
  Filled 2016-02-25: qty 2

## 2016-02-25 MED ORDER — IBUPROFEN 600 MG PO TABS: 600.0000 mg | ORAL_TABLET | Freq: Four times a day (QID) | ORAL | Status: AC | PRN

## 2016-02-25 NOTE — Discharge Instructions (Signed)
Please call Dr. Greig RightMurphy's office as needed for orthopedic follow up.

## 2016-02-25 NOTE — ED Notes (Signed)
Pt c/o sharp/pinching pain to right dorsal foot onset yesterday. No injury. No history thrombosis. Not affected by elevation or position.

## 2016-02-25 NOTE — ED Provider Notes (Signed)
CSN: [id number]     Arrival date & time 02/25/16  1115 History   First MD Initiated Contact with Patient 02/25/16 1129     Chief Complaint  Patient presents with  . Foot Pain    HPI   Curtis Vega is an 25 y.o. male with history of obesity and chronic back pain who presents to the ED for evaluation of left foot pain. He reports pain in his left midfoot that began yesterday. Denies known injury or trauma. Denies swelling. He states it is painful to bear weight. States the pain has increased today. He has not tried anything to alleviate his symptoms. Denies fever, chills, radiation of the pain. 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  Past Medical History  Diagnosis Date  . Back pain, chronic    Past Surgical History  Procedure Laterality Date  . Skin graft      42-28 years old. on Right index finger  . Wisdom tooth extraction     Family History  Problem Relation Age of Onset  . Diabetes Mother   . Osteoarthritis Father   . Diabetes Father   . Heart failure Maternal Aunt    Social History  Substance Use Topics  . Smoking status: Never Smoker   . Smokeless tobacco: None  . Alcohol Use: Yes     Comment: social    Review of Systems  All other systems reviewed and are negative.     Allergies  Asa; Tylenol; Codeine; and Penicillins  Home Medications   Prior to Admission medications   Medication Sig Start Date End Date Taking? Authorizing Provider  chlorpheniramine-HYDROcodone (TUSSIONEX PENNKINETIC ER) 10-8 MG/5ML SUER Take 5 mLs by mouth every 12 (twelve) hours as needed for cough. 10/10/15   John Molpus, MD  DULoxetine (CYMBALTA) 20 MG capsule Take 20 mg by mouth every evening.     Historical Provider, MD  ibuprofen (ADVIL,MOTRIN) 200 MG tablet Take 200-800 mg by mouth every 6 (six) hours as needed for moderate pain.    Historical Provider, MD  Iloperidone (FANAPT) 6 MG TABS Take 6 mg by mouth 2 (two) times  daily.     Historical Provider, MD  lamoTRIgine (LAMICTAL) 25 MG tablet Take 25 mg by mouth 2 (two) times daily.    Historical Provider, MD   BP 182/104 mmHg  Pulse 71  Temp(Src) 98.1 F (36.7 C) (Oral)  Resp 16  SpO2 96% Physical Exam  Constitutional: He is oriented to person, place, and time. No distress.  Obese, NAD  HENT:  Head: Atraumatic.  Right Ear: External ear normal.  Left Ear: External ear normal.  Nose: Nose normal.  Eyes: Conjunctivae are normal. No scleral icterus.  Neck: Normal range of motion. Neck supple.  Cardiovascular: Normal rate and regular rhythm.   Pulmonary/Chest: Effort normal. No respiratory distress. He exhibits no tenderness.  Abdominal: Soft. He exhibits no distension. There is no tenderness.  Musculoskeletal:  Dorsum of left foot diffusely ttp. No edema. FROM of ankle and toes. No discoloration. 2+ pulses. Brisk cap refill.  Neurological: He is alert and oriented to person, place, and time.  Skin: Skin is warm and dry. He is not diaphoretic.  Psychiatric: He has a normal mood and affect. His behavior is normal.  Nursing note and vitals reviewed.   ED Course  Procedures (including critical care time) Labs Review Labs Reviewed - No data to display  Imaging Review Dg Foot Complete  Left  02/25/2016  CLINICAL DATA:  Right dorsal foot pain starting yesterday EXAM: LEFT FOOT - COMPLETE 3+ VIEW COMPARISON:  None. FINDINGS: Three views of the right foot submitted. No acute fracture or subluxation. No radiopaque foreign body. No periosteal reaction or bony erosion. IMPRESSION: Negative. Electronically Signed   By: Natasha MeadLiviu  Pop M.D.   On: 02/25/2016 11:55   I have personally reviewed and evaluated these images and lab results as part of my medical decision-making.   EKG Interpretation None      MDM   Final diagnoses:  Left foot pain    X-ray negative. Pt neurovascularly intact. ACE wrap provided. Ibuprofen as needed for pain. Encouraged RICE  therapy. Referral to ortho given for f/u. ER return precautions given.    Carlene CoriaSerena Y Bret Stamour, PA-C 02/25/16 1342  Raeford RazorStephen Kohut, MD 03/08/16 2126

## 2016-08-05 ENCOUNTER — Encounter (HOSPITAL_COMMUNITY): Payer: Self-pay | Admitting: Emergency Medicine

## 2016-08-05 ENCOUNTER — Emergency Department (HOSPITAL_COMMUNITY)
Admission: EM | Admit: 2016-08-05 | Discharge: 2016-08-05 | Disposition: A | Discharge status: Home / Self Care | Payer: Medicaid Other | Attending: Emergency Medicine | Admitting: Emergency Medicine

## 2016-08-05 ENCOUNTER — Emergency Department (HOSPITAL_COMMUNITY): Payer: Medicaid Other

## 2016-08-05 DIAGNOSIS — S6990XA Unspecified injury of unspecified wrist, hand and finger(s), initial encounter: Secondary | ICD-10-CM

## 2016-08-05 DIAGNOSIS — S62613A Displaced fracture of proximal phalanx of left middle finger, initial encounter for closed fracture: Secondary | ICD-10-CM | POA: Insufficient documentation

## 2016-08-05 DIAGNOSIS — W231XXA Caught, crushed, jammed, or pinched between stationary objects, initial encounter: Secondary | ICD-10-CM | POA: Diagnosis not present

## 2016-08-05 DIAGNOSIS — S6992XA Unspecified injury of left wrist, hand and finger(s), initial encounter: Secondary | ICD-10-CM | POA: Diagnosis present

## 2016-08-05 DIAGNOSIS — Y92321 Football field as the place of occurrence of the external cause: Secondary | ICD-10-CM | POA: Insufficient documentation

## 2016-08-05 DIAGNOSIS — S62619A Displaced fracture of proximal phalanx of unspecified finger, initial encounter for closed fracture: Secondary | ICD-10-CM

## 2016-08-05 DIAGNOSIS — Y999 Unspecified external cause status: Secondary | ICD-10-CM | POA: Diagnosis not present

## 2016-08-05 DIAGNOSIS — Y9361 Activity, american tackle football: Secondary | ICD-10-CM | POA: Insufficient documentation

## 2016-08-05 NOTE — ED Provider Notes (Signed)
[facility]-EMERGENCY DEPT Provider Note   CSN: [id number] Arrival date & time: 08/05/16  1057   By signing my name below, I, Teofilo PodMatthew P. Jamison, attest that this documentation has been prepared under the direction and in the presence of [address]Mykia Holton, VF CorporationPA-C. Electronically Signed: Teofilo PodMatthew P. Jamison, ED Scribe. 08/05/2016. 12:18 PM.   History   Chief Complaint Chief Complaint  Patient presents with  . Finger Injury    l/hand 3rd finger    The history is provided by the patient and medical records. No language interpreter was used.  Hand Injury   The incident occurred yesterday. Incident location: at football practice. The injury mechanism was a direct blow. The pain is present in the left fingers. The quality of the pain is described as throbbing. The pain is at a severity of 10/10. The pain is severe. The pain has been constant since the incident. The symptoms are aggravated by movement. He has tried ice for the symptoms. The treatment provided mild relief.  HPI Comments:  Curtis Vega is a 25 y.o. male who presents to the Emergency Department s/p injury to his left 3rd finger that occurred yesterday. Pt reports that his finger bent backwards after hitting it on the ground during football practice. Pt describes his pain as 10/10 constant throbbing L middle finger pain radiating somewhat into the wrist, worse with trying to move his finger, and mildly improved with ice. Pt notes that he has oberserved mild swelling to the injured finger since the injury. Pt denies numbness, tingling, focal weakness, bruising, wounds, or any other injuries/complaints.    Past Medical History:  Diagnosis Date  . Back pain, chronic     There are no active problems to display for this patient.   Past Surgical History:  Procedure Laterality Date  . ADENOIDECTOMY    . SKIN GRAFT     9413-89 years old. on Right index finger  . WISDOM TOOTH EXTRACTION    . WISDOM TOOTH EXTRACTION         Home  Medications    Prior to Admission medications   Medication Sig Start Date End Date Taking? Authorizing Provider  chlorpheniramine-HYDROcodone (TUSSIONEX PENNKINETIC ER) 10-8 MG/5ML SUER Take 5 mLs by mouth every 12 (twelve) hours as needed for cough. 10/10/15   John Molpus, MD  DULoxetine (CYMBALTA) 20 MG capsule Take 20 mg by mouth every evening.     Historical Provider, MD  ibuprofen (ADVIL,MOTRIN) 200 MG tablet Take 200-800 mg by mouth every 6 (six) hours as needed for moderate pain.    Historical Provider, MD  ibuprofen (ADVIL,MOTRIN) 600 MG tablet Take 1 tablet (600 mg total) by mouth every 6 (six) hours as needed. 02/25/16   Carlene CoriaSerena Y Sam, PA-C  Iloperidone (FANAPT) 6 MG TABS Take 6 mg by mouth 2 (two) times daily.     Historical Provider, MD  lamoTRIgine (LAMICTAL) 25 MG tablet Take 25 mg by mouth 2 (two) times daily.    Historical Provider, MD    Family History Family History  Problem Relation Age of Onset  . Diabetes Mother   . Osteoarthritis Father   . Diabetes Father   . Cancer Father   . Heart failure Maternal Aunt     Social History Social History  Substance Use Topics  . Smoking status: Never Smoker  . Smokeless tobacco: Never Used  . Alcohol use Yes     Comment: social     Allergies   Asa [aspirin]; Tylenol [acetaminophen]; Codeine; and Penicillins  Review of Systems Review of Systems  Musculoskeletal: Positive for arthralgias and joint swelling.  Skin: Negative for color change and wound.  Allergic/Immunologic: Negative for immunocompromised state.  Neurological: Negative for weakness and numbness.     Physical Exam Updated Vital Signs BP 141/78 (BP Location: Left Arm)   Pulse 69   Temp 98.1 F (36.7 C) (Oral)   Resp 18   SpO2 98%   Physical Exam  Constitutional: He is oriented to person, place, and time. Vital signs are normal. He appears well-developed and well-nourished.  Non-toxic appearance. No distress.  Afebrile, nontoxic, NAD  HENT:    Head: Normocephalic and atraumatic.  Mouth/Throat: Mucous membranes are normal.  Eyes: Conjunctivae and EOM are normal. Right eye exhibits no discharge. Left eye exhibits no discharge.  Neck: Normal range of motion. Neck supple.  Cardiovascular: Normal rate and intact distal pulses.   Pulmonary/Chest: Effort normal. No respiratory distress.  Abdominal: Normal appearance. He exhibits no distension.  Musculoskeletal:       Left hand: He exhibits decreased range of motion (due to pain), tenderness, bony tenderness and swelling. He exhibits normal capillary refill, no deformity and no laceration. Normal sensation noted. Normal strength noted.  Left 3rd finger with minimally limited ROM due to pain, although still able to flex and extend at PIP/DIP/MCP joints, with mild TTP at the PIP joint, mild swelling, no bruising or erythema, no warmth, no deformity or crepitus, with strength and sensation grossly intact, distal pulses intact, and soft compartments.   Neurological: He is alert and oriented to person, place, and time. He has normal strength. No sensory deficit.  Skin: Skin is warm, dry and intact. No rash noted.  Psychiatric: He has a normal mood and affect.  Nursing note and vitals reviewed.    ED Treatments / Results  DIAGNOSTIC STUDIES:  Oxygen Saturation is 98% on RA, normal by my interpretation.    COORDINATION OF CARE:  12:18 PM Discussed treatment plan with pt at bedside and pt agreed to plan.   Labs (all labs ordered are listed, but only abnormal results are displayed) Labs Reviewed - No data to display  EKG  EKG Interpretation None       Radiology Dg Finger Middle Left  Result Date: 08/05/2016 CLINICAL DATA:  Jammed middle finger into a punching bag yesterday. EXAM: LEFT MIDDLE FINGER 2+V COMPARISON:  None. FINDINGS: There is a tiny bony fragment adjacent to the ulnar aspect of the head of the third proximal phalanx which may reflect sequela of avulsive injury.  There is no other fracture or dislocation. There is no evidence of arthropathy or other focal bone abnormality. Soft tissues are unremarkable. IMPRESSION: Tiny bony fragment adjacent to the ulnar aspect of the head of the third proximal phalanx which may reflect sequela of avulsive injury. Electronically Signed   By: Elige KoHetal  Patel   On: 08/05/2016 12:20    Procedures Procedures (including critical care time)  Medications Ordered in ED Medications - No data to display   Initial Impression / Assessment and Plan / ED Course  I have reviewed the triage vital signs and the nursing notes.  Pertinent labs & imaging results that were available during my care of the patient were reviewed by me and considered in my medical decision making (see chart for details).  Clinical Course     25 y.o. male here with L 3rd digit pain after jamming it yesterday. Mild swelling, mild PIP joint tenderness, NVI with soft compartments, ROM limited due  to pain but still able to flex/extend somewhat at all joints of the digit. Will await xray results and reassess  12:38 PM Xray showing small avulsion fx, will splint; discussed strict adherence to splinting in case there is a mild underlying tendon/ligamentous injury. RICE/tylenol/motrin for pain discussed, f/up with hand specialist in 5-7 days for recheck and ongoing management. I explained the diagnosis and have given explicit precautions to return to the ER including for any other new or worsening symptoms. The patient understands and accepts the medical plan as it's been dictated and I have answered their questions. Discharge instructions concerning home care and prescriptions have been given. The patient is STABLE and is discharged to home in good condition.   I personally performed the services described in this documentation, which was scribed in my presence. The recorded information has been reviewed and is accurate.   Final Clinical Impressions(s) / ED Diagnoses    Final diagnoses:  Closed avulsion fracture of proximal phalanx of finger, initial encounter  Jammed finger (interphalangeal joint), initial encounter    New Prescriptions New Prescriptions   No medications on file      Allen Derry, PA-C 08/05/16 1243    Lorre Nick, MD 08/05/16 1658

## 2016-08-05 NOTE — ED Triage Notes (Signed)
Pt injured 3rd finger of l/hand. Pt bent finger during football practice yesterday

## 2016-08-05 NOTE — ED Notes (Signed)
Bed: WTR6 Expected date:  Expected time:  Means of arrival:  Comments: 

## 2016-08-05 NOTE — Discharge Instructions (Signed)
Wear finger splint at all times until you see the hand specialist. Ice and elevate finger  throughout the day, using ice pack for no more than 20 minutes every hour.  Alternate between tylenol and motrin for pain relief. Call hand specialist follow up today or tomorrow to schedule followup appointment for recheck and ongoing management of your finger injury in the next 5-7 days. Return to the ER for changes or worsening symptoms.

## 2016-08-05 NOTE — Progress Notes (Signed)
Medicaid [place] access response hx indicates the [facility] [address] [place] [id number] [phone]

## 2016-11-22 ENCOUNTER — Emergency Department (HOSPITAL_COMMUNITY)
Admission: EM | Admit: 2016-11-22 | Discharge: 2016-11-22 | Disposition: A | Discharge status: Home / Self Care | Payer: Medicaid Other | Attending: Emergency Medicine | Admitting: Emergency Medicine

## 2016-11-22 ENCOUNTER — Emergency Department (HOSPITAL_COMMUNITY): Payer: Medicaid Other

## 2016-11-22 ENCOUNTER — Encounter (HOSPITAL_COMMUNITY): Payer: Self-pay | Admitting: Emergency Medicine

## 2016-11-22 DIAGNOSIS — Z79899 Other long term (current) drug therapy: Secondary | ICD-10-CM | POA: Insufficient documentation

## 2016-11-22 DIAGNOSIS — M5441 Lumbago with sciatica, right side: Secondary | ICD-10-CM | POA: Diagnosis not present

## 2016-11-22 DIAGNOSIS — M5442 Lumbago with sciatica, left side: Secondary | ICD-10-CM | POA: Diagnosis not present

## 2016-11-22 DIAGNOSIS — M545 Low back pain: Secondary | ICD-10-CM | POA: Diagnosis present

## 2016-11-22 MED ORDER — PREDNISONE 10 MG (21) PO TBPK: ORAL_TABLET | Freq: Every day | ORAL | 0 refills | Status: AC

## 2016-11-22 MED ORDER — CYCLOBENZAPRINE HCL 10 MG PO TABS: 10.0000 mg | ORAL_TABLET | Freq: Three times a day (TID) | ORAL | 0 refills | Status: AC | PRN

## 2016-11-22 MED ORDER — KETOROLAC TROMETHAMINE 30 MG/ML IJ SOLN
30.0000 mg | Freq: Once | INTRAMUSCULAR | Status: AC
  Administered 2016-11-22: 30 mg via INTRAMUSCULAR
  Filled 2016-11-22: qty 1

## 2016-11-22 NOTE — ED Triage Notes (Signed)
Pt to ER by private vehicle for evaluation of thoracic/lumbar back pain onset yesterday radiating down left leg. Denies bowel/bladder incontinence. Denies numbness, tingling, etc. A/o x4. Ambulatory without difficulty.

## 2016-11-22 NOTE — Discharge Instructions (Signed)
This is likely a bulging disc. Please take the steroids as prescribed for 6 days. Take the Flexeril was a muscle relaxer do not drive with this medication as it'll make you drowsy. May take Tylenol. May take Motrin or Advil starting tomorrow. Follow-up with [place] spine surgery. Return to the ED if her symptoms worsen or if he developed any loss of bowel or bladder, unable to urinate, numbness in her groin, fevers or for any other reason.

## 2016-11-23 NOTE — ED Provider Notes (Signed)
[facility]-EMERGENCY DEPT Provider Note   CSN: [id number] Arrival date & time: 11/22/16  1426     History   Chief Complaint Chief Complaint  Patient presents with  . Back Pain    HPI Curtis Vega is a 26 y.o. male.  HPI 26 yo AA male with pmh sig for chronic back pain presents to the ED with complaint of low back pain that started yesterday. Pt states that the pain stars in his bilateral lower back and radiates down left leg. Pt denies any trauma. He has tried nothing for the pain. Sitting makes the worse. Standing makes the pain better. He denies any urinary retention, bowel or bladder incontinence, saddle paresthesias, lower extremity paresthesias, fevers, hx of ivdu, hx of cancer.  Past Medical History:  Diagnosis Date  . Back pain, chronic     There are no active problems to display for this patient.   Past Surgical History:  Procedure Laterality Date  . ADENOIDECTOMY    . SKIN GRAFT     99-71 years old. on Right index finger  . WISDOM TOOTH EXTRACTION    . WISDOM TOOTH EXTRACTION         Home Medications    Prior to Admission medications   Medication Sig Start Date End Date Taking? Authorizing Provider  chlorpheniramine-HYDROcodone (TUSSIONEX PENNKINETIC ER) 10-8 MG/5ML SUER Take 5 mLs by mouth every 12 (twelve) hours as needed for cough. 10/10/15   John Molpus, MD  cyclobenzaprine (FLEXERIL) 10 MG tablet Take 1 tablet (10 mg total) by mouth 3 (three) times daily as needed for muscle spasms. 11/22/16   Rise Mu, PA-C  DULoxetine (CYMBALTA) 20 MG capsule Take 20 mg by mouth every evening.     Historical Provider, MD  ibuprofen (ADVIL,MOTRIN) 200 MG tablet Take 200-800 mg by mouth every 6 (six) hours as needed for moderate pain.    Historical Provider, MD  ibuprofen (ADVIL,MOTRIN) 600 MG tablet Take 1 tablet (600 mg total) by mouth every 6 (six) hours as needed. 02/25/16   Carlene Coria, PA-C  Iloperidone (FANAPT) 6 MG TABS Take 6 mg by mouth 2 (two) times daily.      Historical Provider, MD  lamoTRIgine (LAMICTAL) 25 MG tablet Take 25 mg by mouth 2 (two) times daily.    Historical Provider, MD  predniSONE (STERAPRED UNI-PAK 21 TAB) 10 MG (21) TBPK tablet Take by mouth daily. Take 6 tabs by mouth daily  for day 1, then 5 tabs for day 2, then 4 tabs for day 3, then 3 tabs for day 4 , 2 tabs for day 5, then 1 tab by mouth for day 6 11/22/16   Rise Mu, PA-C    Family History Family History  Problem Relation Age of Onset  . Diabetes Mother   . Osteoarthritis Father   . Diabetes Father   . Cancer Father   . Heart failure Maternal Aunt     Social History Social History  Substance Use Topics  . Smoking status: Never Smoker  . Smokeless tobacco: Never Used  . Alcohol use Yes     Comment: social     Allergies   Asa [aspirin]; Tylenol [acetaminophen]; Codeine; and Penicillins   Review of Systems Review of Systems  Constitutional: Negative for chills and fever.  Gastrointestinal: Negative for nausea.  Musculoskeletal: Positive for back pain. Negative for neck pain and neck stiffness.  Skin: Negative for wound.  Neurological: Negative for dizziness, syncope, weakness, light-headedness, numbness and headaches.  All  other systems reviewed and are negative.    Physical Exam Updated Vital Signs BP 149/95 (BP Location: Right Arm)   Pulse 77   Temp 98.3 F (36.8 C) (Oral)   Resp 16   Ht 5\' 11"  (1.803 m)   Wt (!) 160.2 kg   SpO2 97%   BMI 49.26 kg/m   Physical Exam  Constitutional: He is oriented to person, place, and time. He appears well-developed and well-nourished. No distress.  Obese male  Eyes: Conjunctivae are normal. Right eye exhibits no discharge. Left eye exhibits no discharge. No scleral icterus.  Neck: Normal range of motion. Neck supple.  No midline tenderness. No deformity of step offs noted.   Pulmonary/Chest: No respiratory distress.  Musculoskeletal: Normal range of motion.  No midline t sine or l spine  tendernes. Bilateral lumbar para spinal tenderness to palpation. Full rom. No deformity or step offs noted. Bilateral straight leg raise that reproduces pain and mild paresthesias.   Neurological: He is alert and oriented to person, place, and time.  Strength 5/5 in lower extremities. Sensation intact to shar.dull. Cap refill normal. Patellar reflexes normoal.   Skin: Skin is warm and dry. Capillary refill takes less than 2 seconds. No pallor.  Nursing note and vitals reviewed.    ED Treatments / Results  Labs (all labs ordered are listed, but only abnormal results are displayed) Labs Reviewed - No data to display  EKG  EKG Interpretation None       Radiology Dg Lumbar Spine Complete  Result Date: 11/22/2016 CLINICAL DATA:  26 y/o  M; back pain radiating down the left leg. EXAM: LUMBAR SPINE - COMPLETE 4+ VIEW COMPARISON:  None. FINDINGS: Four lumbar type non-rib-bearing vertebral bodies and sacralization of L5 vertebral body with rudimentary disc. Mild loss of height of the L3-4 intervertebral disc space. No acute fracture or malalignment. IMPRESSION: Transitional lumbosacral anatomy. Mild loss of height of the L3-4 intervertebral disc space. No acute fracture or malalignment. Electronically Signed   By: Mitzi Hansen M.D.   On: 11/22/2016 16:10    Procedures Procedures (including critical care time)  Medications Ordered in ED Medications  ketorolac (TORADOL) 30 MG/ML injection 30 mg (30 mg Intramuscular Given 11/22/16 1632)     Initial Impression / Assessment and Plan / ED Course  I have reviewed the triage vital signs and the nursing notes.  Pertinent labs & imaging results that were available during my care of the patient were reviewed by me and considered in my medical decision making (see chart for details).     Patient with back pain. Neurovascularly intact.  No neurological deficits and normal neuro exam.  Patient can walk but states is painful.  No loss of  bowel or bladder control.  No concern for cauda equina.  No fever, night sweats, weight loss, h/o cancer, IVDU.  Xray without fracture. Does not mild disc height. Will give steroids and Toradol. Pt given referral to spine surgeon. RICE protocol and pain medicine indicated and discussed with patient. Dicussed strict return precautions. Pt verbalized understanding with plan of care. All questions answered prior to discharge.   Final Clinical Impressions(s) / ED Diagnoses   Final diagnoses:  Acute bilateral low back pain with bilateral sciatica    New Prescriptions Discharge Medication List as of 11/22/2016  4:25 PM    START taking these medications   Details  cyclobenzaprine (FLEXERIL) 10 MG tablet Take 1 tablet (10 mg total) by mouth 3 (three) times daily as needed  for muscle spasms., Starting Fri 11/22/2016, Print    predniSONE (STERAPRED UNI-PAK 21 TAB) 10 MG (21) TBPK tablet Take by mouth daily. Take 6 tabs by mouth daily  for day 1, then 5 tabs for day 2, then 4 tabs for day 3, then 3 tabs for day 4 , 2 tabs for day 5, then 1 tab by mouth for day 6, Starting Fri 11/22/2016, Print         Rise MuKenneth T Raylene Carmickle, PA-C 11/23/16 2039    Melene Planan Floyd, DO 11/26/16 [phone]

## 2017-03-12 DIAGNOSIS — Y929 Unspecified place or not applicable: Secondary | ICD-10-CM | POA: Insufficient documentation

## 2017-03-12 DIAGNOSIS — Z5321 Procedure and treatment not carried out due to patient leaving prior to being seen by health care provider: Secondary | ICD-10-CM | POA: Insufficient documentation

## 2017-03-12 DIAGNOSIS — Y9389 Activity, other specified: Secondary | ICD-10-CM | POA: Insufficient documentation

## 2017-03-12 DIAGNOSIS — Y999 Unspecified external cause status: Secondary | ICD-10-CM | POA: Insufficient documentation

## 2017-03-12 DIAGNOSIS — X509XXA Other and unspecified overexertion or strenuous movements or postures, initial encounter: Secondary | ICD-10-CM | POA: Insufficient documentation

## 2017-03-12 DIAGNOSIS — S4991XA Unspecified injury of right shoulder and upper arm, initial encounter: Secondary | ICD-10-CM | POA: Insufficient documentation

## 2017-03-12 NOTE — ED Triage Notes (Signed)
Pt reports he injuried right arm lifting weights

## 2017-03-13 ENCOUNTER — Emergency Department (HOSPITAL_COMMUNITY)
Admission: EM | Admit: 2017-03-13 | Discharge: 2017-03-13 | Disposition: A | Discharge status: Home / Self Care | Payer: Medicaid Other | Attending: Emergency Medicine | Admitting: Emergency Medicine

## 2017-03-13 NOTE — ED Notes (Signed)
Pt called in waiting area with no reply.

## 2017-09-05 ENCOUNTER — Emergency Department (HOSPITAL_COMMUNITY)
Admission: EM | Admit: 2017-09-05 | Discharge: 2017-09-05 | Disposition: A | Discharge status: Home / Self Care | Payer: No Typology Code available for payment source | Attending: Emergency Medicine | Admitting: Emergency Medicine

## 2017-09-05 ENCOUNTER — Encounter (HOSPITAL_COMMUNITY): Payer: Self-pay | Admitting: Emergency Medicine

## 2017-09-05 ENCOUNTER — Emergency Department (HOSPITAL_COMMUNITY): Payer: No Typology Code available for payment source

## 2017-09-05 DIAGNOSIS — R202 Paresthesia of skin: Secondary | ICD-10-CM | POA: Insufficient documentation

## 2017-09-05 DIAGNOSIS — S161XXA Strain of muscle, fascia and tendon at neck level, initial encounter: Secondary | ICD-10-CM | POA: Diagnosis not present

## 2017-09-05 DIAGNOSIS — Y939 Activity, unspecified: Secondary | ICD-10-CM | POA: Diagnosis not present

## 2017-09-05 DIAGNOSIS — Y92410 Unspecified street and highway as the place of occurrence of the external cause: Secondary | ICD-10-CM | POA: Diagnosis not present

## 2017-09-05 DIAGNOSIS — Y999 Unspecified external cause status: Secondary | ICD-10-CM | POA: Diagnosis not present

## 2017-09-05 DIAGNOSIS — S39012A Strain of muscle, fascia and tendon of lower back, initial encounter: Secondary | ICD-10-CM | POA: Insufficient documentation

## 2017-09-05 DIAGNOSIS — S199XXA Unspecified injury of neck, initial encounter: Secondary | ICD-10-CM | POA: Diagnosis present

## 2017-09-05 DIAGNOSIS — M62830 Muscle spasm of back: Secondary | ICD-10-CM | POA: Diagnosis not present

## 2017-09-05 DIAGNOSIS — M5442 Lumbago with sciatica, left side: Secondary | ICD-10-CM

## 2017-09-05 DIAGNOSIS — M5441 Lumbago with sciatica, right side: Secondary | ICD-10-CM

## 2017-09-05 MED ORDER — IBUPROFEN 200 MG PO TABS
600.0000 mg | ORAL_TABLET | Freq: Once | ORAL | Status: AC
  Administered 2017-09-05: 600 mg via ORAL
  Filled 2017-09-05: qty 3

## 2017-09-05 MED ORDER — IBUPROFEN 600 MG PO TABS: 600.0000 mg | ORAL_TABLET | Freq: Three times a day (TID) | ORAL | 0 refills | Status: AC | PRN

## 2017-09-05 MED ORDER — CYCLOBENZAPRINE HCL 10 MG PO TABS
10.0000 mg | ORAL_TABLET | Freq: Three times a day (TID) | ORAL | 0 refills | Status: AC | PRN
Start: 2017-09-05 — End: ?

## 2017-09-05 MED ORDER — PREDNISONE 20 MG PO TABS: ORAL_TABLET | ORAL | 0 refills | Status: AC

## 2017-09-05 NOTE — Discharge Instructions (Signed)
Take ibuprofen as directed for inflammation and pain with tylenol for breakthrough pain and flexeril for muscle relaxation. Do not drive or operate machinery with muscle relaxant use. Use prednisone as directed to help with the tingling in your legs.  Avoid heavy lifting over 10 pounds for the next 2 weeks. Use ice to areas of soreness for the next 24 hours and then may move to heat, no more than 20 minutes at a time every hour for each. Expect to be sore for the next few days and follow up with primary care physician for recheck of ongoing symptoms in the next 1-2 weeks. Return to ER for emergent changing or worsening of symptoms.
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
## 2017-09-05 NOTE — ED Notes (Signed)
Pt verbalizes understanding of d/c paperwork, follow up instructions, and medications. Pt A/O x4, ambulatory. All belongings with patient upon departure.  

## 2017-09-05 NOTE — ED Notes (Signed)
Patient ambulated to X-ray 
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
## 2019-06-17 ENCOUNTER — Other Ambulatory Visit: Payer: Self-pay

## 2019-06-17 ENCOUNTER — Encounter (HOSPITAL_COMMUNITY): Payer: Self-pay

## 2019-06-17 ENCOUNTER — Emergency Department (HOSPITAL_COMMUNITY)
Admission: EM | Admit: 2019-06-17 | Discharge: 2019-06-17 | Disposition: A | Discharge status: Home / Self Care | Payer: Medicaid Other | Attending: Emergency Medicine | Admitting: Emergency Medicine

## 2019-06-17 DIAGNOSIS — M79641 Pain in right hand: Secondary | ICD-10-CM | POA: Diagnosis present

## 2019-06-17 DIAGNOSIS — Z79899 Other long term (current) drug therapy: Secondary | ICD-10-CM | POA: Insufficient documentation

## 2019-06-17 DIAGNOSIS — M65331 Trigger finger, right middle finger: Secondary | ICD-10-CM | POA: Insufficient documentation

## 2019-06-17 MED ORDER — DICLOFENAC SODIUM 75 MG PO TBEC: 75.0000 mg | DELAYED_RELEASE_TABLET | Freq: Two times a day (BID) | ORAL | 0 refills | Status: AC
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
## 2019-06-17 NOTE — Discharge Instructions (Addendum)
Schedule to see Dr. Kuzma for evaluation  °

## 2019-06-17 NOTE — ED Triage Notes (Signed)
Pt reports that his middle finger on rt hand has been lock and that he is unable to make it straight unless he pries it open. Pt reports some tingle in rt hand. Pt reports that symptoms started 3 days ago. Pt reports 8/10 pain.

## 2022-06-07 ENCOUNTER — Emergency Department (HOSPITAL_COMMUNITY)
Admission: EM | Admit: 2022-06-07 | Discharge: 2022-06-07 | Disposition: A | Discharge status: Home / Self Care | Payer: Medicaid Other | Attending: Emergency Medicine | Admitting: Emergency Medicine

## 2022-06-07 ENCOUNTER — Emergency Department (HOSPITAL_COMMUNITY): Payer: Medicaid Other

## 2022-06-07 ENCOUNTER — Other Ambulatory Visit: Payer: Self-pay

## 2022-06-07 DIAGNOSIS — S8991XA Unspecified injury of right lower leg, initial encounter: Secondary | ICD-10-CM | POA: Diagnosis present

## 2022-06-07 DIAGNOSIS — W500XXA Accidental hit or strike by another person, initial encounter: Secondary | ICD-10-CM | POA: Diagnosis not present

## 2022-06-07 MED ORDER — NAPROXEN 500 MG PO TABS
500.0000 mg | ORAL_TABLET | Freq: Once | ORAL | Status: AC
  Administered 2022-06-07: 500 mg via ORAL
  Filled 2022-06-07: qty 1

## 2022-06-07 MED ORDER — NAPROXEN 500 MG PO TABS: 500.0000 mg | ORAL_TABLET | Freq: Two times a day (BID) | ORAL | 0 refills | Status: AC | PRN
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
## 2022-06-07 NOTE — ED Provider Notes (Signed)
[facility] DEPT Provider Note   CSN: [id number] Arrival date & time: 06/07/22  0501     History  Chief Complaint  Patient presents with   Knee Injury    Curtis Vega is a 31 y.o. male who presents to the ED with complaints of right knee pain status post injury yesterday.  Patient reports he was standing on the side of the football field when one of the players fell into the lateral aspect of his right knee.  He has been having pain to the knee since then, worse with movement, no alleviating factors.  Denies any other areas of injury.  Denies numbness, tingling, or weakness.  HPI     Home Medications Prior to Admission medications   Medication Sig Start Date End Date Taking? Authorizing Provider  chlorpheniramine-HYDROcodone (TUSSIONEX PENNKINETIC ER) 10-8 MG/5ML SUER Take 5 mLs by mouth every 12 (twelve) hours as needed for cough. 10/10/15   Molpus, John, MD  cyclobenzaprine (FLEXERIL) 10 MG tablet Take 1 tablet (10 mg total) by mouth 3 (three) times daily as needed for muscle spasms. 11/22/16   Doristine Devoid, PA-C  cyclobenzaprine (FLEXERIL) 10 MG tablet Take 1 tablet (10 mg total) by mouth 3 (three) times daily as needed for muscle spasms. 09/05/17   Street, [place], PA-C  diclofenac (VOLTAREN) 75 MG EC tablet Take 1 tablet (75 mg total) by mouth 2 (two) times daily. 06/17/19   Fransico Meadow, PA-C  DULoxetine (CYMBALTA) 20 MG capsule Take 20 mg by mouth every evening.     [provider]  ibuprofen (ADVIL,MOTRIN) 200 MG tablet Take 200-800 mg by mouth every 6 (six) hours as needed for moderate pain.    [provider]  ibuprofen (ADVIL,MOTRIN) 600 MG tablet Take 1 tablet (600 mg total) by mouth every 6 (six) hours as needed. 02/25/16   Tomi Likens, PA-C  ibuprofen (ADVIL,MOTRIN) 600 MG tablet Take 1 tablet (600 mg total) by mouth every 8 (eight) hours as needed for fever, headache, mild pain, moderate pain or cramping.  09/05/17   Street, [place], PA-C  Iloperidone (FANAPT) 6 MG TABS Take 6 mg by mouth 2 (two) times daily.     [provider]  lamoTRIgine (LAMICTAL) 25 MG tablet Take 25 mg by mouth 2 (two) times daily.    [provider]  predniSONE (DELTASONE) 20 MG tablet 3 tabs po daily x 4 days 09/05/17   Street, [place], PA-C  predniSONE (STERAPRED UNI-PAK 21 TAB) 10 MG (21) TBPK tablet Take by mouth daily. Take 6 tabs by mouth daily  for day 1, then 5 tabs for day 2, then 4 tabs for day 3, then 3 tabs for day 4 , 2 tabs for day 5, then 1 tab by mouth for day 6 11/22/16   Ocie Cornfield T, PA-C      Allergies    Asa [aspirin], Tylenol [acetaminophen], Codeine, and Penicillins    Review of Systems   Review of Systems  Constitutional:  Negative for chills and fever.  Cardiovascular:  Negative for chest pain.  Gastrointestinal:  Negative for abdominal pain.  Musculoskeletal:  Positive for arthralgias.  Neurological:  Negative for weakness and numbness.  All other systems reviewed and are negative.   Physical Exam Updated Vital Signs BP (!) 148/93 (BP Location: Right Arm)   Pulse (!) 57   Temp 98.3 F (36.8 C) (Oral)   Resp 16   Ht 5\' 10"  (1.778 m)   Wt (!) 145.2  kg   SpO2 96%   BMI 45.92 kg/m  Physical Exam Vitals and nursing note reviewed.  Constitutional:      General: He is not in acute distress.    Appearance: He is not ill-appearing or toxic-appearing.  HENT:     Head: Normocephalic and atraumatic.  Cardiovascular:     Pulses:          Dorsalis pedis pulses are 2+ on the right side and 2+ on the left side.       Posterior tibial pulses are 2+ on the right side and 2+ on the left side.  Pulmonary:     Effort: Pulmonary effort is normal.  Musculoskeletal:     Comments: Lower extremities: No obvious deformity, appreciable swelling, edema, erythema, ecchymosis, warmth, or open wounds. Patient has intact AROM to bilateral hips, knees, ankles, and all digits.  Tender to palpation to the diffuse right knee, primarily to the medial joint line.  Does have pain with valgus and varus stress test, valgus greater than varus..  No obvious significant joint instability.  Otherwise nontender.  Skin:    General: Skin is warm and dry.     Capillary Refill: Capillary refill takes less than 2 seconds.  Neurological:     Mental Status: He is alert.     Comments: Alert. Clear speech. Sensation grossly intact to bilateral lower extremities. 5/5 strength with knee flexion/extension and ankle plantar/dorsiflexion bilaterally.  Psychiatric:        Mood and Affect: Mood normal.        Behavior: Behavior normal.     ED Results / Procedures / Treatments   Labs (all labs ordered are listed, but only abnormal results are displayed) Labs Reviewed - No data to display  EKG None  Radiology DG Knee Complete 4 Views Right  Result Date: 06/07/2022 CLINICAL DATA:  31 year old male with history of right knee pain. EXAM: RIGHT KNEE - COMPLETE 4+ VIEW COMPARISON:  No priors. FINDINGS: No evidence of fracture, dislocation, or joint effusion. No evidence of arthropathy or other focal bone abnormality. Soft tissues are unremarkable. IMPRESSION: Negative. Electronically Signed   By: Trudie Reed M.D.   On: 06/07/2022 05:41    Procedures Procedures    Medications Ordered in ED Medications - No data to display  ED Course/ Medical Decision Making/ A&P                           Medical Decision Making Amount and/or Complexity of Data Reviewed Radiology: ordered.   Patient presents to the ED with complaints of pain to the right knee pain s/p injury yesterday. Exam without obvious deformity or open wounds. ROM intact. Tender to palpation diffusely however most prominently to the medial joint line. NVI distally. Xray negative for fracture/dislocation, I viewed and interpreted imaging and agree with radiologist.  Question ligamentous injury, will place in knee immobilizer,  orthopedics follow-up.  Discussed PRICE and will prescribe NSAID.  History of aspirin allergy however patient states he tolerates NSAIDs without difficulty.  I discussed results, treatment plan, need for follow-up, and return precautions with the patient. Provided opportunity for questions, patient confirmed understanding and are in agreement with plan.          Final Clinical Impression(s) / ED Diagnoses Final diagnoses:  Injury of right knee, initial encounter    Rx / DC Orders ED Discharge Orders          Ordered    naproxen (  NAPROSYN) 500 MG tablet  2 times daily PRN        06/07/22 0612              Cherly Anderson, PA-C 06/07/22 5993    Sabas Sous, MD 06/07/22 [phone]

## 2022-06-07 NOTE — ED Triage Notes (Signed)
Ambulatory to ED with c/o R knee pain. 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 States it hurts to bend to step up.

## 2024-01-27 ENCOUNTER — Encounter (HOSPITAL_COMMUNITY): Payer: Self-pay

## 2024-01-27 ENCOUNTER — Emergency Department (HOSPITAL_COMMUNITY)
Admission: EM | Admit: 2024-01-27 | Discharge: 2024-01-28 | Discharge status: Against Medical Advice | Payer: MEDICAID | Attending: Emergency Medicine | Admitting: Emergency Medicine

## 2024-01-27 ENCOUNTER — Other Ambulatory Visit: Payer: Self-pay

## 2024-01-27 DIAGNOSIS — M79671 Pain in right foot: Secondary | ICD-10-CM | POA: Diagnosis present

## 2024-01-27 DIAGNOSIS — Z5321 Procedure and treatment not carried out due to patient leaving prior to being seen by health care provider: Secondary | ICD-10-CM | POA: Diagnosis not present

## 2024-01-27 NOTE — ED Triage Notes (Signed)
 Right foot pain above big toe. 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 Flare up started a few months ago.

## 2024-01-28 NOTE — ED Notes (Signed)
 Pt informed registration he was leaving d/t wait time
